# Patient Record
Sex: Female | Born: 1975 | Race: White | Hispanic: No | Smoking: Never smoker
Health system: Southern US, Community
[De-identification: ages and names within clinical notes are randomized; demographics above are authoritative.]

## PROBLEM LIST (undated history)

## (undated) DIAGNOSIS — C50411 Malignant neoplasm of upper-outer quadrant of right female breast: Secondary | ICD-10-CM

## (undated) DIAGNOSIS — J45909 Unspecified asthma, uncomplicated: Secondary | ICD-10-CM

## (undated) DIAGNOSIS — K219 Gastro-esophageal reflux disease without esophagitis: Secondary | ICD-10-CM

## (undated) DIAGNOSIS — M1990 Unspecified osteoarthritis, unspecified site: Secondary | ICD-10-CM

## (undated) DIAGNOSIS — C539 Malignant neoplasm of cervix uteri, unspecified: Secondary | ICD-10-CM

## (undated) DIAGNOSIS — A419 Sepsis, unspecified organism: Secondary | ICD-10-CM

## (undated) DIAGNOSIS — J189 Pneumonia, unspecified organism: Secondary | ICD-10-CM

## (undated) DIAGNOSIS — C50911 Malignant neoplasm of unspecified site of right female breast: Secondary | ICD-10-CM

## (undated) DIAGNOSIS — G43909 Migraine, unspecified, not intractable, without status migrainosus: Secondary | ICD-10-CM

## (undated) DIAGNOSIS — Z9289 Personal history of other medical treatment: Secondary | ICD-10-CM

## (undated) HISTORY — DX: Malignant neoplasm of upper-outer quadrant of right female breast: C50.411

## (undated) HISTORY — PX: FRACTURE SURGERY: SHX138

## (undated) NOTE — *Deleted (*Deleted)
[facility]  [address] [place],  [place]  [address] [phone]  Clinic Day:  09/22/2020  Referring physician: Galvin Proffer, MD   This document serves as a record of services personally performed by Gery Pray, MD. It was created on their behalf by Curry,Lauren E, a trained medical scribe. The creation of this record is based on the scribe's personal observations and the provider's statements to them.   CHIEF COMPLAINT:  CC: Metastatic breast cancer  Current Treatment:  denosumab   HISTORY OF PRESENT ILLNESS:  Monica Poole is a 55 y.o. female with a history of stage IIB right breast cancer diagnosed in June 2018.  Estrogen and progesterone receptors were positive at 95% and HER2 negative.  Ki67 was 70%.  She was seen at [facility] in July 2018 for consultation, but did not pursue treatment with [place] at that time.  Testing for hereditary breast and ovarian cancer with the [facility] Hereditary Cancer panel test in July 2018 did not reveal any clinically significant mutation or variants of uncertain significance.  She was going to go to [facility] for a second opinion, but did not pursue this, and we did not hear back from her regarding her cancer.  Her port was removed in 2018 due to septic infection.  She went to [facility] in [place] and was placed on neoadjuvant chemotherapy for 8 months, but with limited response.  In March 2019, she underwent bilateral mastectomies, but had complications with her reconstruction.  She had expanders placed, but never had the permanent implants inserted.  She received adjuvant radiation.  She was then placed on letrozole, but only completed 6 months.  She started having pain in her right hip in February 2020.  Hip x-rays in April revealed multifocal sclerotic bony metastases.  She then underwent a biopsy of the left anterior iliac bone in May, which confirmed metastatic  carcinoma favoring breast primary.  She was placed on palbociclib/fulvestrant, but only completed 4 months.  These were stopped due to problems with insurance and transportation to [place].    We began seeing her again in November 2020, at which time she was on fentanyl 25 mcg/hour with oxycodone/APAP 10/325 every 4 hours around the clock.  She is also on prednisone 20 mg daily for asthma, which she has been on chronically.  CT chest, abdomen, and pelvis in November revealed innumerable small sclerotic metastases throughout the axial and proximal appendicular skeleton, increased in size, number and degree of sclerosis, favoring progression of osseous metastatic disease.  No lymphadenopathy or other findings of extraosseous metastatic disease in the chest, abdomen, or pelvis.  She also underwent a nuclear medicine whole body bone scan, which revealed osseous metastases at left iliac bone, thoracic spine, and bilateral proximal femora.  We resumed palbociclib/fulvestrant in November.  She has had continued severe pain, especially of the hips, and we have had to titrate her narcotics steadily.  She is currently on fentanyl 100 mcg per hour with oxycodone 20 mg every 4 hours as needed for pain.  She received palliative radiation to the bilateral hips completed in December.  She was referred to plastic surgeon regarding her expanders.  She was added to the schedule on December 31st due to severe arthralgias, which she rated 10/10.  Her pain was significantly worse than usual and was in multiple areas.  Her current pain regimen was not effective.  She was on prednisone 20 mg once  daily, so we increased this to twice daily for pain.  She was instructed to take prednisone 20 mg three times daily temporarily.  Pantoprazole 40 mg twice daily was added due to the high-dose prednisone.  She had palmar erythrodysesthesia and was given information on that diagnosis and how to manage it.  She missed some of her injections due to  complications of her reconstructive surgery and we had her palbociclib on hold so that she could heal properly.    CT chest, abdomen and pelvis from May 3rd revealed development of a segment 3 hypoattenuating liver lesion, measuring 1.8 cm, concerning for new metastasis.  No other evidence of soft tissue metastasis within the chest, abdomen or pelvis.  Osseous metastasis are primarily similar.  New and progressive areas of sclerosis involving the T6 and S1 vertebral bodies were seen.  Labs from the same day were unremarkable except for a borderline low calcium of 8.6.  We had her increase her oral calcium 600 mg to three times daily.   Her pain was doing better this summer.    She is here for follow up prior to fulvestrant and denosumab.  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  She was so weak that she had to come in in a wheelchair.  She also had severe swelling of legs and face, and so I instructed her to taper the prednisone gradually.  She is down to prednisone 10 mg daily with improvement in her edema and bilateral leg pain.  Her calcium was slightly high, but she continued the same dose at my instructions due to Xgeva injections.  Left diagnostic mammogram and left breast ultrasound from October 7th revealed an indeterminate 1.2 cm mass involving the lower outer quadrant of the reconstructed left breast at the inframammary fold.  This may represent focal post surgical dense fibrosis or fat necrosis, though an oil cyst could not be confirmed on the spot tangential mammogram.  Biopsy was recommended and scheduled, but she missed this appointment.  CT chest, abdomen and pelvis from October 7th revealed interval increase in the size of a hypodense metastatic lesion of the left lobe of the liver measuring 3.2 x 2.4 cm, previously 2.1 x 2.0 cm.  Suspect subtle, new hypodense lesions of the superior left lobe of the liver and adjacent liver dome,  and interval increase in multiple sclerotic osseous lesions throughout the vertebral bodies, sternum and pelvis.  Overall constellation of findings consistent with worsened metastatic disease.  She reports pain of her upper back/shoulders and neck.  I advised that she finish her current cycle of palbociclib as she has 4 days left.  She has a scratch of her left hand from her cat that is inflamed and warm, so I will place her on Keflex 500 mg for 7 days.  She can continue her diuretic as needed, but I have advised her to use as little as possible.  She is scheduled with her dentist on October 28th for a broken tooth, so we will hold denosumab.  Her white count has decreased from 16.5 to 1.8 with an ANC of 850, her hemoglobin and platelets are normal.  Chemistries are unremarkable except for a potassium of 2.9, previously 4.0, and a calcium of 8.3.  Her appetite is good, and her weight is stable since her last visit.  She denies fever or chills.  She denies nausea, vomiting, bowel issues, or abdominal pain.  She  denies sore throat, cough, dyspnea, or chest pain.   INTERVAL HISTORY:  Monica Poole is here for follow up ***.   Her  appetite is good, and she has gained/lost _ pounds since her last visit.  She denies fever, chills or other signs of infection.  She denies nausea, vomiting, bowel issues, or abdominal pain.  She denies sore throat, cough, dyspnea, or chest pain.   REVIEW OF SYSTEMS:  Review of Systems - Oncology   VITALS:  There were no vitals taken for this visit.  Wt Readings from Last 3 Encounters:  09/17/20 151 lb 4 oz (68.6 kg)  08/27/20 151 lb 4 oz (68.6 kg)  03/02/20 139 lb 6.4 oz (63.2 kg)    There is no height or weight on file to calculate BMI.  Performance status (ECOG): {CHL ONC [id number]  PHYSICAL EXAM:  Physical Exam  LABS:   CBC Latest Ref Rng & Units 08/25/2020 12/15/2019 07/29/2018  WBC - 1.8 3.5 8.6  Hemoglobin 12.0 - 16.0 13.1 13.8 11.8(L)  Hematocrit 36 - 46 39  41.1 37.5  Platelets 150 - 399 262 297 207   CMP Latest Ref Rng & Units 08/25/2020 07/29/2018 12/04/2017  Glucose 70 - 99 mg/dL - 161(W) 960(A)  BUN 4 - 21 7 11 8   Creatinine 0.5 - 1.1 0.8 0.84 0.79  Sodium 137 - 147 138 137 139  Potassium 3.4 - 5.3 2.9(A) 3.4(L) 4.0  Chloride 99 - 108 101 103 111  CO2 13 - 22 32(A) 23 17(L)  Calcium 8.7 - 10.7 8.3(A) 8.8(L) 8.8(L)  Total Protein 6.5 - 8.1 g/dL - 6.5 -  Total Bilirubin 0.3 - 1.2 mg/dL - 1.0 -  Alkaline Phos 25 - 125 70 119 -  AST 13 - 35 37(A) 28 -  ALT 7 - 35 24 22 -     No results found for: CEA1 / No results found for: CEA1 No results found for: PSA1 No results found for: VWU981 No results found for: XBJ478  No results found for: TOTALPROTELP, ALBUMINELP, A1GS, A2GS, BETS, BETA2SER, GAMS, MSPIKE, SPEI No results found for: TIBC, FERRITIN, IRONPCTSAT No results found for: LDH   STUDIES:  No results found.   Allergies:  Allergies  Allergen Reactions  . Bee Venom Anaphylaxis  . Dicyclomine Other (See Comments)    CARDIAC ARREST   . Latex Rash and Other (See Comments)    BLISTERS   . Wasp Venom Protein Anaphylaxis  . Bentyl [Dicyclomine Hcl] Other (See Comments)    CARDIAC ARREST  . Tape Rash and Other (See Comments)    Blisters, also- ONLY PAPER TAPE!!    Current Medications: Current Outpatient Medications  Medication Sig Dispense Refill  . albuterol (ACCUNEB) 1.25 MG/3ML nebulizer solution Take 1 ampule by nebulization every 4 (four) hours as needed for wheezing.     Marland Kitchen albuterol (VENTOLIN HFA) 108 (90 Base) MCG/ACT inhaler Can inhale two puffs every four to six hours as needed for cough or wheeze. 8 g 1  . budesonide (PULMICORT) 0.5 MG/2ML nebulizer solution Take 0.5 mg by nebulization 2 (two) times daily as needed (wheezing).    . calcium-vitamin D (OSCAL WITH D) 500-200 MG-UNIT tablet Take 1 tablet by mouth.    . denosumab (XGEVA) 120 MG/1.7ML SOLN injection Inject 120 mg into the skin once.    . fentaNYL  (DURAGESIC) 100 MCG/HR 1 patch every 3 (three) days.    . fentaNYL (DURAGESIC) 50 MCG/HR SMARTSIG:50 MCG Topical Every 3 Days    .  Fluticasone-Umeclidin-Vilant (TRELEGY ELLIPTA) 200-62.5-25 MCG/INH AEPB Inhale 1 Dose into the lungs daily. Rinse, gargle, and spit after use. 60 each 5  . fulvestrant (FASLODEX) 250 MG/5ML injection Inject 500 mg into the muscle once. One injection each buttock over 1-2 minutes. Warm prior to use.    . furosemide (LASIX) 20 MG tablet Take 20 mg by mouth.    Marland Kitchen ipratropium (ATROVENT) 0.06 % nasal spray Can use one to two sprays in each nostril every six hours as needed to dry up nose. 15 mL 5  . Ipratropium-Albuterol (COMBIVENT RESPIMAT) 20-100 MCG/ACT AERS respimat Inhale 1 puff into the lungs every 6 (six) hours.    Marland Kitchen ipratropium-albuterol (DUONEB) 0.5-2.5 (3) MG/3ML SOLN Take 3 mLs by nebulization every 6 (six) hours as needed. (Patient taking differently: Take 3 mLs by nebulization every 6 (six) hours as needed (for shortness of breath). ) 3 mL 5  . magic mouthwash SOLN Take by mouth.    . meclizine (ANTIVERT) 25 MG tablet Take 25 mg by mouth 3 (three) times daily as needed.    . mometasone-formoterol (DULERA) 200-5 MCG/ACT AERO Inhale two puffs twice daily to prevent cough or wheeze.  Rinse, gargle, and spit after use. (Patient taking differently: Inhale 2 puffs into the lungs 2 (two) times daily as needed (for "flares" and rinse mouth afterwards). ) 1 Inhaler 5  . ondansetron (ZOFRAN) 4 MG tablet Take 1 tablet (4 mg total) by mouth every 8 (eight) hours as needed for nausea or vomiting. 20 tablet 0  . ondansetron (ZOFRAN) 8 MG tablet Take 8 mg by mouth every 8 (eight) hours as needed for nausea or vomiting.     . Oxycodone HCl 20 MG TABS Take 1 tablet by mouth every 4 (four) hours as needed.    . palbociclib (IBRANCE) 125 MG tablet Take 125 mg by mouth daily. Take for 21 days on, 7 days off, repeat every 28 days.    . pantoprazole (PROTONIX) 40 MG tablet Take 40 mg by  mouth 2 (two) times daily.    . predniSONE (DELTASONE) 20 MG tablet Take 20 mg by mouth daily with breakfast.    . prochlorperazine (COMPAZINE) 10 MG tablet Take 10 mg by mouth every 8 (eight) hours as needed for nausea or vomiting.     . triamcinolone cream (KENALOG) 0.1 % Apply 1 application topically as needed (to affected, irritated sites). Prn     . triamcinolone cream (KENALOG) 0.1 % Apply 1 application topically 2 (two) times daily. 30 g 0  . venlafaxine XR (EFFEXOR-XR) 150 MG 24 hr capsule Take 150 mg by mouth daily.    Marland Kitchen venlafaxine XR (EFFEXOR-XR) 75 MG 24 hr capsule Take 75 mg by mouth daily after breakfast.    . XANAX 0.5 MG tablet Take 0.5 mg by mouth 3 (three) times daily as needed.     . zolpidem (AMBIEN) 10 MG tablet Take 10 mg by mouth at bedtime as needed for sleep.     No current facility-administered medications for this visit.     ASSESSMENT & PLAN:   Assessment:   1. Stage IIB right breast cancer diagnosed in June 2018.  This was treated at the [facility] in [place], [place] with neoadjuvant chemotherapy, and adjuvant radiation.  She underwent bilateral mastectomies in March 2019.   2. History of cervical cancer at a young age, treated with surgery and radiation.  3. 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  She was placed on palbociclib/fulvestrant, but only completed 4 months of therapy, then discontinued that on her own.  We assumed follow-up and restarted palbociclib/fulvestrant in November 2020, and started her on monthly denosumab, but she has missed some doses.  She received palliative radiation to the bilateral hips completed in December 2020.  Palbociclib was resumed in May 2021, and then placed on hold this summer due to poor healing of her reconstruction.  We resumed this at the end of June.  4. Depression, now on Effexor dose at 225 mg daily with fairly good  control of her symptoms.  She still lacks motivation at times.  5. Bilateral breast reconstruction, with implants placed on February 17th.  This was healing well, but had to be removed due to the skin splitting open.  I do feel a nodule in the lateral left reconstruction and a biopsy was scheduled.  In view of these findings, we will hold off for now, and monitor this area.  6. Diagnosis of breast cancer at a very young age.  She did not have clinically significant mutation or variants of uncertain significance on [facility]Risk done in July 2018.  Testing for PIK3CA was negative.  7. Lymphedema of the right upper extremity.    8. 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   9. Palmar erythrodysesthesia, secondary to palbociclib, which is stable.  10. Hypodense metastatic liver lesion of the left lobe now measuring 3.2 x 2.4 cm.  Suspect subtle, new hypodense lesions were also observed on most recent CT imaging in October.  11. Mild hypocalcemia.  We will have her continue oral calcium 600 mg three times daily.  12. Severe bilateral leg pain and proximal muscle weakness, improved with prednisone taper.  She is now down to 10 mg daily.  Her edema has improved as well.  13. Dyspepsia and acid reflux, also related to her corticosteroids.  She will continue the PPI twice daily.  14. Severe hypokalemia due to diuretic.  Her edema has improved and so she will decrease this.  We will administer 20 meq IV and place her on oral supplement 20 meq BID.  Plan: CT chest, abdomen and pelvis revealed interval increase in the size of a hypodense metastatic lesion of the left lobe of the liver measuring 3.2 x 2.4 cm, previously 2.1 x 2.0 cm.  Suspect subtle, new hypodense lesions of the superior left lobe of the liver and adjacent liver dome, and interval increase in multiple sclerotic osseous lesions throughout the vertebral bodies, sternum and pelvis were also seen.  We discussed the imaging findings  with her and I now recommend that we switch treatments.  Options for oral therapy would include hormonal therapy with Aromasin as well as oral chemotherapy with Afinitor 10 mg daily.  Systemic options would include Taxotere, eribulin, Navelbine, gemcitabine or a combination of gemcitabine/Navelbine.  I reviewed the schedule and possible toxicities associated with these therapies.  The patient declines port placement due to previous complications, but is agreeable to a PICC line for systemic treatment.  We will give her some time to make a decision.    She knows to finish her current cycle of palbociclib and we will administer one final fulvestrant injection tomorrow.  We will hold off on denosumab due to her upcoming dental work for broken tooth.  As her potassium is low we will also administer 20 meq IV and place her on oral supplement 20 meq BID as well.  I will  send in a prescription for Keflex 500 mg TID for 7 days for her probable skin injection.  For now, we will hold off on breast biopsy since she will require a change in therapy anyway.  I will send in refills for her pain medications, Ambien and Xanax today.  The patient verbalizes understanding of an agreement to the plan today.  She knows to call the office should any new questions or concerns arise.   I provided *** minutes (1:53 PM - 1:53 PM) of face-to-face time during this this encounter and > 50% was spent counseling as documented under my assessment and plan.    Dellia Beckwith, MD [facility] [address] [place] [address] Dept: [phone] Dept Fax: [phone]   I, Foye Deer, am acting as scribe for Dellia Beckwith, MD  I have reviewed this report as typed by the medical scribe, and it is complete and accurate.

---
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
## 1998-03-11 ENCOUNTER — Emergency Department (HOSPITAL_COMMUNITY): Admission: EM | Admit: 1998-03-11 | Discharge: 1998-03-11 | Payer: Self-pay | Admitting: Emergency Medicine

## 1998-03-15 ENCOUNTER — Other Ambulatory Visit: Admission: RE | Admit: 1998-03-15 | Discharge: 1998-03-15 | Payer: Self-pay | Admitting: Obstetrics & Gynecology

## 1998-03-30 ENCOUNTER — Inpatient Hospital Stay (HOSPITAL_COMMUNITY): Admission: AD | Admit: 1998-03-30 | Discharge: 1998-03-30 | Payer: Self-pay | Admitting: Family Medicine

## 1998-04-24 ENCOUNTER — Inpatient Hospital Stay (HOSPITAL_COMMUNITY): Admission: AD | Admit: 1998-04-24 | Discharge: 1998-04-24 | Payer: Self-pay | Admitting: Obstetrics & Gynecology

## 1998-05-10 ENCOUNTER — Ambulatory Visit (HOSPITAL_COMMUNITY): Admission: RE | Admit: 1998-05-10 | Discharge: 1998-05-10 | Payer: Self-pay | Admitting: Obstetrics and Gynecology

## 1998-10-12 ENCOUNTER — Inpatient Hospital Stay (HOSPITAL_COMMUNITY): Admission: AD | Admit: 1998-10-12 | Discharge: 1998-10-15 | Payer: Self-pay | Admitting: *Deleted

## 1999-06-15 ENCOUNTER — Other Ambulatory Visit: Admission: RE | Admit: 1999-06-15 | Discharge: 1999-06-15 | Payer: Self-pay | Admitting: Obstetrics and Gynecology

## 2000-01-03 ENCOUNTER — Inpatient Hospital Stay (HOSPITAL_COMMUNITY): Admission: AD | Admit: 2000-01-03 | Discharge: 2000-01-03 | Payer: Self-pay | Admitting: Obstetrics and Gynecology

## 2000-01-16 ENCOUNTER — Inpatient Hospital Stay (HOSPITAL_COMMUNITY): Admission: AD | Admit: 2000-01-16 | Discharge: 2000-01-18 | Payer: Self-pay | Admitting: Obstetrics and Gynecology

## 2014-06-22 DIAGNOSIS — R319 Hematuria, unspecified: Secondary | ICD-10-CM | POA: Insufficient documentation

## 2015-01-29 HISTORY — PX: WRIST FRACTURE SURGERY: SHX121

## 2017-04-26 HISTORY — PX: BREAST BIOPSY: SHX20

## 2017-04-29 HISTORY — PX: PORTA CATH INSERTION: CATH118285

## 2017-05-21 DIAGNOSIS — R590 Localized enlarged lymph nodes: Secondary | ICD-10-CM | POA: Diagnosis not present

## 2017-05-21 DIAGNOSIS — C50411 Malignant neoplasm of upper-outer quadrant of right female breast: Secondary | ICD-10-CM | POA: Diagnosis not present

## 2017-05-21 DIAGNOSIS — Z170 Estrogen receptor positive status [ER+]: Secondary | ICD-10-CM | POA: Diagnosis not present

## 2017-05-24 DIAGNOSIS — C50911 Malignant neoplasm of unspecified site of right female breast: Secondary | ICD-10-CM | POA: Insufficient documentation

## 2017-05-25 DIAGNOSIS — C50411 Malignant neoplasm of upper-outer quadrant of right female breast: Secondary | ICD-10-CM | POA: Diagnosis not present

## 2017-05-28 DIAGNOSIS — C50511 Malignant neoplasm of lower-outer quadrant of right female breast: Secondary | ICD-10-CM | POA: Diagnosis present

## 2017-05-30 DIAGNOSIS — F419 Anxiety disorder, unspecified: Secondary | ICD-10-CM | POA: Diagnosis not present

## 2017-05-30 DIAGNOSIS — R945 Abnormal results of liver function studies: Secondary | ICD-10-CM | POA: Diagnosis not present

## 2017-05-30 DIAGNOSIS — N649 Disorder of breast, unspecified: Secondary | ICD-10-CM | POA: Diagnosis not present

## 2017-05-30 DIAGNOSIS — C50911 Malignant neoplasm of unspecified site of right female breast: Secondary | ICD-10-CM | POA: Diagnosis not present

## 2017-05-30 DIAGNOSIS — K769 Liver disease, unspecified: Secondary | ICD-10-CM | POA: Diagnosis not present

## 2017-05-30 HISTORY — PX: PORTA CATH REMOVAL: CATH118286

## 2017-06-08 DIAGNOSIS — C773 Secondary and unspecified malignant neoplasm of axilla and upper limb lymph nodes: Secondary | ICD-10-CM | POA: Diagnosis not present

## 2017-06-08 DIAGNOSIS — Z170 Estrogen receptor positive status [ER+]: Secondary | ICD-10-CM | POA: Diagnosis not present

## 2017-06-08 DIAGNOSIS — C50911 Malignant neoplasm of unspecified site of right female breast: Secondary | ICD-10-CM | POA: Diagnosis not present

## 2017-06-08 DIAGNOSIS — F419 Anxiety disorder, unspecified: Secondary | ICD-10-CM | POA: Diagnosis not present

## 2017-10-16 ENCOUNTER — Encounter (HOSPITAL_COMMUNITY): Payer: Self-pay

## 2017-10-16 ENCOUNTER — Inpatient Hospital Stay (HOSPITAL_COMMUNITY): Payer: BLUE CROSS/BLUE SHIELD

## 2017-10-16 ENCOUNTER — Inpatient Hospital Stay (HOSPITAL_COMMUNITY)
Admission: EM | Admit: 2017-10-16 | Discharge: 2017-10-18 | DRG: 202 | Disposition: A | Discharge status: Home / Self Care | Payer: BLUE CROSS/BLUE SHIELD | Attending: Family Medicine | Admitting: Family Medicine

## 2017-10-16 ENCOUNTER — Other Ambulatory Visit: Payer: Self-pay

## 2017-10-16 DIAGNOSIS — Z170 Estrogen receptor positive status [ER+]: Secondary | ICD-10-CM | POA: Diagnosis not present

## 2017-10-16 DIAGNOSIS — C50911 Malignant neoplasm of unspecified site of right female breast: Secondary | ICD-10-CM | POA: Diagnosis present

## 2017-10-16 DIAGNOSIS — E876 Hypokalemia: Secondary | ICD-10-CM | POA: Diagnosis present

## 2017-10-16 DIAGNOSIS — E871 Hypo-osmolality and hyponatremia: Secondary | ICD-10-CM | POA: Diagnosis present

## 2017-10-16 DIAGNOSIS — J189 Pneumonia, unspecified organism: Secondary | ICD-10-CM | POA: Diagnosis not present

## 2017-10-16 DIAGNOSIS — D638 Anemia in other chronic diseases classified elsewhere: Secondary | ICD-10-CM | POA: Diagnosis present

## 2017-10-16 DIAGNOSIS — D899 Disorder involving the immune mechanism, unspecified: Secondary | ICD-10-CM | POA: Diagnosis present

## 2017-10-16 DIAGNOSIS — J440 Chronic obstructive pulmonary disease with acute lower respiratory infection: Secondary | ICD-10-CM | POA: Diagnosis present

## 2017-10-16 DIAGNOSIS — J209 Acute bronchitis, unspecified: Secondary | ICD-10-CM | POA: Diagnosis present

## 2017-10-16 DIAGNOSIS — J9811 Atelectasis: Secondary | ICD-10-CM | POA: Diagnosis present

## 2017-10-16 DIAGNOSIS — C799 Secondary malignant neoplasm of unspecified site: Secondary | ICD-10-CM | POA: Diagnosis present

## 2017-10-16 DIAGNOSIS — J45909 Unspecified asthma, uncomplicated: Secondary | ICD-10-CM | POA: Diagnosis not present

## 2017-10-16 DIAGNOSIS — R0602 Shortness of breath: Secondary | ICD-10-CM

## 2017-10-16 DIAGNOSIS — Z66 Do not resuscitate: Secondary | ICD-10-CM | POA: Diagnosis present

## 2017-10-16 HISTORY — DX: Personal history of other medical treatment: Z92.89

## 2017-10-16 HISTORY — DX: Migraine, unspecified, not intractable, without status migrainosus: G43.909

## 2017-10-16 HISTORY — DX: Malignant neoplasm of cervix uteri, unspecified: C53.9

## 2017-10-16 HISTORY — DX: Unspecified asthma, uncomplicated: J45.909

## 2017-10-16 HISTORY — DX: Malignant neoplasm of unspecified site of right female breast: C50.911

## 2017-10-16 HISTORY — DX: Pneumonia, unspecified organism: J18.9

## 2017-10-16 HISTORY — DX: Unspecified osteoarthritis, unspecified site: M19.90

## 2017-10-16 LAB — COMPREHENSIVE METABOLIC PANEL
ALT: 40 U/L (ref 14–54)
AST: 33 U/L (ref 15–41)
Albumin: 3.8 g/dL (ref 3.5–5.0)
Alkaline Phosphatase: 47 U/L (ref 38–126)
Anion gap: 9 (ref 5–15)
BUN: 11 mg/dL (ref 6–20)
CO2: 21 mmol/L — ABNORMAL LOW (ref 22–32)
Calcium: 9 mg/dL (ref 8.9–10.3)
Chloride: 101 mmol/L (ref 101–111)
Creatinine, Ser: 0.76 mg/dL (ref 0.44–1.00)
GFR calc Af Amer: >60 mL/min (ref >60)
GFR calc non Af Amer: >60 mL/min (ref >60)
Glucose, Bld: 114 mg/dL — ABNORMAL HIGH (ref 65–99)
Potassium: 4 mmol/L (ref 3.5–5.1)
Sodium: 131 mmol/L — ABNORMAL LOW (ref 135–145)
Total Bilirubin: 0.7 mg/dL (ref 0.3–1.2)
Total Protein: 6.6 g/dL (ref 6.5–8.1)

## 2017-10-16 LAB — URINALYSIS, ROUTINE W REFLEX MICROSCOPIC
Bilirubin Urine: NEGATIVE
Glucose, UA: NEGATIVE mg/dL
Hgb urine dipstick: NEGATIVE
Ketones, ur: NEGATIVE mg/dL
Leukocytes, UA: NEGATIVE
Nitrite: NEGATIVE
Protein, ur: NEGATIVE mg/dL
Specific Gravity, Urine: 1.008 (ref 1.005–1.030)
pH: 6 (ref 5.0–8.0)

## 2017-10-16 LAB — CBC WITH DIFFERENTIAL/PLATELET
Basophils Absolute: 0 10*3/uL (ref 0.0–0.1)
Basophils Relative: 0 %
Eosinophils Absolute: 0 10*3/uL (ref 0.0–0.7)
Eosinophils Relative: 0 %
HCT: 30.8 % — ABNORMAL LOW (ref 36.0–46.0)
Hemoglobin: 10.2 g/dL — ABNORMAL LOW (ref 12.0–15.0)
Lymphocytes Relative: 3 %
Lymphs Abs: 0.2 10*3/uL — ABNORMAL LOW (ref 0.7–4.0)
MCH: 32.3 pg (ref 26.0–34.0)
MCHC: 33.1 g/dL (ref 30.0–36.0)
MCV: 97.5 fL (ref 78.0–100.0)
Monocytes Absolute: 0.1 10*3/uL (ref 0.1–1.0)
Monocytes Relative: 1 %
Neutro Abs: 5.3 10*3/uL (ref 1.7–7.7)
Neutrophils Relative %: 96 %
Platelets: 254 10*3/uL (ref 150–400)
RBC: 3.16 MIL/uL — ABNORMAL LOW (ref 3.87–5.11)
RDW: 17 % — ABNORMAL HIGH (ref 11.5–15.5)
WBC: 5.5 10*3/uL (ref 4.0–10.5)

## 2017-10-16 LAB — I-STAT BETA HCG BLOOD, ED (MC, WL, AP ONLY): I-stat hCG, quantitative: <5 m[IU]/mL (ref <5)

## 2017-10-16 LAB — LACTIC ACID, PLASMA
Lactic Acid, Venous: 2 mmol/L (ref 0.5–1.9)
Lactic Acid, Venous: 2.1 mmol/L (ref 0.5–1.9)

## 2017-10-16 LAB — PROTIME-INR
INR: 1.03
Prothrombin Time: 13.4 seconds (ref 11.4–15.2)

## 2017-10-16 LAB — I-STAT CG4 LACTIC ACID, ED
Lactic Acid, Venous: 2.52 mmol/L (ref 0.5–1.9)
Lactic Acid, Venous: 2.1 mmol/L (ref 0.5–1.9)

## 2017-10-16 LAB — INFLUENZA PANEL BY PCR (TYPE A & B)
Influenza A By PCR: NEGATIVE
Influenza B By PCR: NEGATIVE

## 2017-10-16 MED ORDER — SODIUM CHLORIDE 0.9 % IV BOLUS (SEPSIS)
1000.0000 mL | Freq: Once | INTRAVENOUS | Status: AC
  Administered 2017-10-16: 1000 mL via INTRAVENOUS

## 2017-10-16 MED ORDER — OXYCODONE HCL 5 MG PO TABS: 30.0000 mg | ORAL_TABLET | Freq: Two times a day (BID) | ORAL | Status: DC | PRN

## 2017-10-16 MED ORDER — ALBUTEROL SULFATE (2.5 MG/3ML) 0.083% IN NEBU: 2.5000 mg | INHALATION_SOLUTION | RESPIRATORY_TRACT | Status: DC | PRN

## 2017-10-16 MED ORDER — VANCOMYCIN HCL 10 G IV SOLR
1500.0000 mg | Freq: Once | INTRAVENOUS | Status: AC
  Administered 2017-10-16: 1500 mg via INTRAVENOUS
  Filled 2017-10-16: qty 1500

## 2017-10-16 MED ORDER — VANCOMYCIN HCL IN DEXTROSE 750-5 MG/150ML-% IV SOLN
750.0000 mg | Freq: Three times a day (TID) | INTRAVENOUS | Status: DC
  Filled 2017-10-16: qty 150

## 2017-10-16 MED ORDER — VANCOMYCIN HCL IN DEXTROSE 750-5 MG/150ML-% IV SOLN
750.0000 mg | Freq: Three times a day (TID) | INTRAVENOUS | Status: DC
  Administered 2017-10-16 – 2017-10-18 (×5): 750 mg via INTRAVENOUS
  Filled 2017-10-16 (×6): qty 150

## 2017-10-16 MED ORDER — ENOXAPARIN SODIUM 40 MG/0.4ML ~~LOC~~ SOLN
40.0000 mg | SUBCUTANEOUS | Status: DC
  Administered 2017-10-16 – 2017-10-17 (×3): 40 mg via SUBCUTANEOUS
  Filled 2017-10-16 (×3): qty 0.4

## 2017-10-16 MED ORDER — BUDESONIDE 0.5 MG/2ML IN SUSP: 0.5000 mg | Freq: Two times a day (BID) | RESPIRATORY_TRACT | Status: DC | PRN

## 2017-10-16 MED ORDER — OXYCODONE HCL 5 MG PO TABS
30.0000 mg | ORAL_TABLET | Freq: Two times a day (BID) | ORAL | Status: DC | PRN
  Administered 2017-10-16 – 2017-10-17 (×3): 30 mg via ORAL
  Filled 2017-10-16 (×4): qty 6

## 2017-10-16 MED ORDER — IPRATROPIUM-ALBUTEROL 0.5-2.5 (3) MG/3ML IN SOLN
3.0000 mL | RESPIRATORY_TRACT | Status: DC
  Administered 2017-10-16 – 2017-10-17 (×4): 3 mL via RESPIRATORY_TRACT
  Filled 2017-10-16 (×4): qty 3

## 2017-10-16 MED ORDER — LEVALBUTEROL HCL 0.63 MG/3ML IN NEBU
0.6300 mg | INHALATION_SOLUTION | Freq: Once | RESPIRATORY_TRACT | Status: AC
  Administered 2017-10-16: 0.63 mg via RESPIRATORY_TRACT
  Filled 2017-10-16: qty 3

## 2017-10-16 MED ORDER — DEXTROSE 5 % IV SOLN
1.0000 g | Freq: Three times a day (TID) | INTRAVENOUS | Status: DC
  Administered 2017-10-17 – 2017-10-18 (×5): 1 g via INTRAVENOUS
  Filled 2017-10-16 (×7): qty 1

## 2017-10-16 MED ORDER — METHYLPREDNISOLONE SODIUM SUCC 125 MG IJ SOLR
125.0000 mg | Freq: Once | INTRAMUSCULAR | Status: AC
  Administered 2017-10-16: 125 mg via INTRAVENOUS
  Filled 2017-10-16: qty 2

## 2017-10-16 MED ORDER — SODIUM CHLORIDE 0.9 % IV SOLN
INTRAVENOUS | Status: DC
  Administered 2017-10-16: 17:00:00 via INTRAVENOUS

## 2017-10-16 MED ORDER — IPRATROPIUM-ALBUTEROL 0.5-2.5 (3) MG/3ML IN SOLN
3.0000 mL | RESPIRATORY_TRACT | Status: DC
  Filled 2017-10-16: qty 3

## 2017-10-16 MED ORDER — DEXTROSE 5 % IV SOLN
1.0000 g | Freq: Three times a day (TID) | INTRAVENOUS | Status: DC
  Administered 2017-10-16: 1 g via INTRAVENOUS
  Filled 2017-10-16 (×2): qty 1

## 2017-10-16 NOTE — ED Provider Notes (Signed)
[facility] EMERGENCY DEPARTMENT Provider Note   CSN: [id number] Arrival date & time: 10/16/17  1237  History   Chief Complaint No chief complaint on file.  HPI Monica Poole is a 41 y.o. female with Metastatic adenocarcinoma of the right breast (ER+, HER2 -) currently receiving Taxol who presented to the ED from urgent care for a pneumonia. She states that after her first chemotherapy treatment with Taxol (this is her 5th total session, she was previously on another medication, she does not remember the name) she began to experience rhinorrhea this progressed to a cough and SOB. She attempted her home inhalers without relief and took some old prednisone. On 12/17 she continued to have SOB, cough, and high fevers (102F). She called her PCP who prescribed Levaquin. She took 1 day of the Levaquin. This AM she woke up with extreme fatigue and continued high fevers. She felt like this was serious so she sought further medical evaluation at an urgent care. There a chest x-ray illustrated a left basilar infiltrate. She does have a history of asthma with several hospitalizations for PNA and pneumothorax. She does not have a port she gets a PICC line with each chemo session as she previously had an infected port.   She endorses headaches, myalgias, arthralgias, and darker urine. She denies N/V, abdominal pain, CP, diarrhea, dysuria, increased urinary frequency.   Past Medical History:  Diagnosis Date  . Arthritis   . Cancer [facility])    breast   Patient Active Problem List   Diagnosis Date Noted  . Pneumonia 10/16/2017   Past Surgical History:  Procedure Laterality Date  . CERVIX SURGERY     OB History    No data available     Home Medications    Prior to Admission medications   Medication Sig Start Date End Date Taking? Authorizing Provider  budesonide (PULMICORT) 0.5 MG/2ML nebulizer solution Take 0.5 mg by nebulization 2 (two) times daily as needed (wheezing).   Yes  [provider]  Ipratropium-Albuterol (COMBIVENT RESPIMAT) 20-100 MCG/ACT AERS respimat Inhale 2 puffs into the lungs every 6 (six) hours as needed for wheezing.   Yes [provider]  levofloxacin (LEVAQUIN) 750 MG tablet Take 750 mg by mouth daily. 08/12/17 10/25/17 Yes [provider]  oxycodone (ROXICODONE) 30 MG immediate release tablet Take 30 mg by mouth every 12 (twelve) hours.   Yes [provider]   Family History No family history on file.  Social History Social History   Tobacco Use  . Smoking status: Never Smoker  . Smokeless tobacco: Never Used  Substance Use Topics  . Alcohol use: No    Frequency: Never  . Drug use: No   Allergies   Bee venom; Dicyclomine; Latex; and Bentyl [dicyclomine hcl]  Review of Systems All systems reviewed and are negative for acute change except as noted in the HPI.  Physical Exam Updated Vital Signs BP (!) 90/50   Pulse 97   Temp 98.2 F (36.8 C) (Oral)   Resp 17   Ht '5\' 3"'  (1.6 m)   Wt 74.8 kg (165 lb)   SpO2 96%   BMI 29.23 kg/m   General: Well nourished female in no acute distress HENT: Normocephalic, atraumatic, moist mucus membranes, oropharynx is clear without exudates  Pulm: End expiratory wheezing presented, no crackles or rhonchi appreciated CV: RRR, no murmurs, no rubs  Abdomen: Active bowel sounds, soft, non-distended, no tenderness to palpation  Extremities: No LE edema, radial pulses  palpable bilaterally  Skin: Warm and dry with no new rashes noted Neuro: Alert and oriented x 3  ED Treatments / Results  Labs (all labs ordered are listed, but only abnormal results are displayed) Labs Reviewed  COMPREHENSIVE METABOLIC PANEL - Abnormal; Notable for the following components:      Result Value   Sodium 131 (*)    CO2 21 (*)    Glucose, Bld 114 (*)    All other components within normal limits  CBC WITH DIFFERENTIAL/PLATELET - Abnormal; Notable for the following components:    RBC 3.16 (*)    Hemoglobin 10.2 (*)    HCT 30.8 (*)    RDW 17.0 (*)    Lymphs Abs 0.2 (*)    All other components within normal limits  I-STAT CG4 LACTIC ACID, ED - Abnormal; Notable for the following components:   Lactic Acid, Venous 2.52 (*)    All other components within normal limits  CULTURE, BLOOD (ROUTINE X 2)  CULTURE, BLOOD (ROUTINE X 2)  CULTURE, EXPECTORATED SPUTUM-ASSESSMENT  GRAM STAIN  PROTIME-INR  URINALYSIS, ROUTINE W REFLEX MICROSCOPIC  HIV ANTIBODY (ROUTINE TESTING)  STREP PNEUMONIAE URINARY ANTIGEN  I-STAT BETA HCG BLOOD, ED ([facility], [facility], [facility] ONLY)  I-STAT CG4 LACTIC ACID, ED   EKG  EKG Interpretation None      Radiology No results found.  Procedures Procedures (including critical care time)  Medications Ordered in ED Medications  ceFEPIme (MAXIPIME) 1 g in dextrose 5 % 50 mL IVPB (not administered)  vancomycin (VANCOCIN) 1,500 mg in sodium chloride 0.9 % 500 mL IVPB (1,500 mg Intravenous New Bag/Given 10/16/17 1430)  vancomycin (VANCOCIN) IVPB 750 mg/150 ml premix (not administered)  sodium chloride 0.9 % bolus 1,000 mL (not administered)  sodium chloride 0.9 % bolus 1,000 mL (not administered)  sodium chloride 0.9 % bolus 1,000 mL (1,000 mLs Intravenous New Bag/Given 10/16/17 1402)  levalbuterol (XOPENEX) nebulizer solution 0.63 mg (0.63 mg Nebulization Given 10/16/17 1402)   Initial Impression / Assessment and Plan / ED Course  I have reviewed the triage vital signs and the nursing notes.  Pertinent labs & imaging results that were available during my care of the patient were reviewed by me and considered in my medical decision making (see chart for details).    41 y.o. Female with Metastatic adenocarcinoma of the right breast (ER+, HER2 -) currently receiving Taxol who presented to the ED from urgent care for a left basilar pneumonia. She has multiple risk factors for drug resistant organisms therefore we will start Cefepime and Vancomycin. Peripheral  IV started and IVF NS bolus will be given. She is currently hemodynamically stable and not in respiratory distress. She will need to be admitted for further evaluation and management.   She does have a history of asthma. She currently has some end-expiratory wheezing. We will treat her with Xopenex nebulizer.   Discussed with the patient and family at bedside that she will need to be admitted for further treatment. She expresses understanding. Spoke with Family medicine who will admit the patient for further evaluation and management.   She receives her chemotherapy and treatment from [facility] [name].   Final Clinical Impressions(s) / ED Diagnoses   Final diagnoses:  Healthcare-associated pneumonia   ED Discharge Orders    None       Ina Homes, MD 10/16/17 1449    Ina Homes, MD 10/16/17 1510    Macarthur Critchley, MD 10/18/17 1504

## 2017-10-16 NOTE — H&P (Signed)
[facility] Admission History and Physical Service Pager: [phone]  Patient name: Monica Poole Medical record number: [id number] Date of birth: [birth year] Age: 41 y.o. Gender: female  Primary Care Provider: [name], MD Consultants: none Code Status: DNR  Chief Complaint: SOB  Assessment and Plan: Monica Poole is a 41 y.o. female presenting with shortness of breath and pneumonia found at [facility]. PMH is significant for breast cancer  Shortness of breath- likely due to HCAP as seen on outpatient CXR. Patient with symptoms of fever, congestion, and SOB that has been progressively worsening since Friday. Failed outpatient levaquin (day #2). Patient is immunocompromised and has had recent hospital exposure for chemotherapy. Patient also has likely sick contact in airport and airplane. Afebrile in ED, but recent administration of motrin in urgent care prior to admission where temperature was 102. Lactic acid elevated, trended to 2.52>2.10 after 1L fluid bolus in ED. No leukocytosis, WBC of 5.5. Wells score of 2.5 so less likely PE, but cannot rule out given hypercoagulable state due to cancer and frequent travel via plane to [place] for chemotherapy. Unlikely cardiac given age and no cardiac history.  -admit to telemetry, attending Dr. Gwendlyn Deutscher  -strongly consider CTA if respiratory status declines, patient desaturates, or tachycardia persists despite continued abx treatment  -repeat 1L fluid bolus ordered then continue NS at 75cc/hr -trend fever curve -continuous cardiac monitoring -continuous pulse ox  -continue vancomycin (day #1) and cefepime (day #1) for HCAP -urine strep pneumonia antigen pending -treat asthma: duonebs q4hrs, albuterol and pulmicort prn  -steroid burst -am BMP, CBC  -Influenza swab pending, droplet precautions on -obtain 2-view CXR as we cannot see UC CXR -supplemental O2 prn  -incentive spirometry q2hrs while awake  Asthma Triggers are  seasonal changes, pollen, and viral infections. Home meds: pulmicort prn, combivent as needed. Patient with diffuse wheezing on exam. -albuterol prn -pulmicort -duonebs scheduled q4hrs  -will give 1 dose solu-medrol today, can begin oral prednisone on 12/19  Metastatic breast cancer Diagnosed in June 2018. Patient currently on chemotherapy prior to surgery, and then will receive radiation. Patient goes to [place] q3 weeks for chemotherapy, most recently on Wednesday. Patient received Taxol (had 1 of 3 total doses). Patient with no port at the moment, given recent infected port. Uses PICC line during chemo treatments.  -Kpad prn for MSK pain -continue home oxycodone 30mg  q12hrs prn  -will FYI oncologist   Hyponatremia Na of 131 on admission. Unclear cause. Patient euvolemic on exam. -NS @ 100 cc/hr -continue to monitor on daily BMET -consider further work up including urine Na if persistent  Anemia Hgb of 10.2 on admission. No prior CBC's for comparison on EMR or care everywhere. MCV high-normal at 97.5. Possibly secondary to chemotherapy. -continue to monitor on daily BMET -consider anemia panel  FEN/GI: regular diet Prophylaxis: lovenox  Disposition: admit to stepdown for further management  History of Present Illness:  Monica Poole is a 41 y.o. female presenting with pneumonia.  Patient reports that she went to [place], via airplane, on Wednesday. States that symptoms began the following Friday and began with a runny nose but progressively worsened by Sunday. Patient states mucous began to turn yellow and her chest began to get more tight. Patient has also been febrile since Sunday with Tmax of 102 at home. Patient called her PCP on Monday and was prescribed levaquin via telephone but that did not seem to help.   Patient has been using pulmicort breathing treatment,  but chest still felt tight. Patient went to urgent care this morning and was told to come to ED because of pneumonia  seen on CXR. Patient also had measured fever of 102 at UC despite two days of levaquin. Patient has been taking combivent inhaler more frequently, 4 times on Sunday and 4 times yesterday, with minimal relief. Taking oxy IR 30mg  q12H for bone pain.   Patient travels to [place] every 3 weeks via airplane. Patient does not take blood thinners. Patient denies calf pain or edema.   Patient seen in urgent care where CXR showed left basilar infiltrate. ED began abx on arrival for HCAP.   Review Of Systems: Per HPI with the following additions:   Review of Systems  Constitutional: Positive for chills and fever.  HENT: Positive for congestion. Negative for ear pain and tinnitus.   Eyes: Negative for blurred vision and double vision.  Respiratory: Positive for cough and wheezing. Negative for shortness of breath.   Cardiovascular: Negative for chest pain and leg swelling.  Gastrointestinal: Negative for abdominal pain, diarrhea, nausea and vomiting.  Genitourinary: Negative for dysuria.  Musculoskeletal: Positive for joint pain. Negative for falls.  Skin: Negative for rash.  Neurological: Negative for headaches.  Psychiatric/Behavioral: Negative for substance abuse.    Patient Active Problem List   Diagnosis Date Noted  . Pneumonia 10/16/2017    Past Medical History: Past Medical History:  Diagnosis Date  . Arthritis   . Cancer [facility])    breast    Past Surgical History: Past Surgical History:  Procedure Laterality Date  . CERVIX SURGERY      Social History: Social History   Tobacco Use  . Smoking status: Never Smoker  . Smokeless tobacco: Never Used  Substance Use Topics  . Alcohol use: No    Frequency: Never  . Drug use: No   Additional social history: lives with husband, no alcohol drugs or tobacco  Please also refer to relevant sections of EMR.  Family History: No family history on file. Dad's side- "heart problems, valve problems" Father- HTN Mother's side- MI's in  grandmother/grandfather  Allergies and Medications: Allergies  Allergen Reactions  . 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 Dicyclomine Other (See Comments)    Other reaction(s): Other (See Comments) CARDIAC ARREST CARDIAC ARREST   . Latex     Other reaction(s): Other (See Comments) BLISTERS BLISTERS   . Bentyl [Dicyclomine Hcl] Other (See Comments)    Cardiac arrest   No current facility-administered medications on file prior to encounter.    Current Outpatient Medications on File Prior to Encounter  Medication Sig Dispense Refill  . acetaminophen (TYLENOL) 500 MG tablet Take 1,000 mg by mouth every 6 (six) hours as needed for fever.    . budesonide (PULMICORT) 0.5 MG/2ML nebulizer solution Take 0.5 mg by nebulization 2 (two) times daily as needed (wheezing).    Marland Kitchen ibuprofen (ADVIL,MOTRIN) 200 MG tablet Take 600 mg by mouth every 6 (six) hours as needed for fever.    . Ipratropium-Albuterol (COMBIVENT RESPIMAT) 20-100 MCG/ACT AERS respimat Inhale 2 puffs into the lungs every 6 (six) hours as needed for wheezing.    Marland Kitchen levofloxacin (LEVAQUIN) 750 MG tablet Take 750 mg by mouth daily.  0  . oxycodone (ROXICODONE) 30 MG immediate release tablet Take 30 mg by mouth every 12 (twelve) hours.    Marland Kitchen PRESCRIPTION MEDICATION 1 each by Intrauterine route once. paraguard  Objective: BP (!) 94/53   Pulse 93   Temp 98.2 F (36.8 C) (Oral)   Resp 18   Ht 5\' 3"  (1.6 m)   Wt 165 lb (74.8 kg)   SpO2 100%   BMI 29.23 kg/m  Exam: General: Awake and alert, NAD, laying in bed  Eyes: PERRL ENTM: moist mucous membranes  Neck: supple, non tender, no LAD  Cardiovascular: RRR, no MRG, +2 radial pulses bilaterally  Respiratory: course breath sounds throughout, expiratory wheezing throughout, no increased work of breathing  Gastrointestinal: soft, non tender, non distended, bowel sounds normal  MSK: no edema or cyanosis, moves all extremities  equally Derm: no rashes, warm, dry  Neuro: no focal deficits, 5/5 strength in upper and lower extremities bilaterally  Psych: normal affect   Labs and Imaging: CBC BMET  Recent Labs  Lab 10/16/17 1305  WBC 5.5  HGB 10.2*  HCT 30.8*  PLT 254   Recent Labs  Lab 10/16/17 1305  NA 131*  K 4.0  CL 101  CO2 21*  BUN 11  CREATININE 0.76  GLUCOSE 114*  CALCIUM 9.0       Ref. Range 10/16/2017 13:37 10/16/2017 15:38  Lactic Acid, Venous Latest Ref Range: 0.5 - 1.9 mmol/L 2.52 ([facility]) 2.10 ([place])    Caroline More, DO 10/16/2017, 4:28 PM PGY-1, [place] Intern pager: [phone], text pages welcome   FPTS Upper-Level Resident Addendum  I have independently interviewed and examined the patient. I have discussed the above with the original author and agree with their documentation. My edits for correction/addition/clarification are in pink.Please see also any attending notes.   Lucila Maine, DO PGY-2, [place] Service pager: [phone] (text pages welcome through [place])

## 2017-10-16 NOTE — Progress Notes (Signed)
Pharmacy Antibiotic Note  Monica Poole is a 41 y.o. female admitted on 10/16/2017 with pneumonia.  Pharmacy has been consulted for vancomycin dosing.  Active breast cancer patient (last chemo tx Wed 12/12) sent from UC, patient-reported fevers of 102 over several days with productive cough, yellow sputum. Tmax 98.2, wbc 5.5, LA 2.5, Scr 0.76.  Plan: Vancomycin 1500 mg IV load x 1. Vancomcyin 750 mg IV q8 hours Continue cefepime 1 gram IV q 8 hours (per MD) Monitor C/S, renal function, clinical status, vanc trough as indicated F/u LOT/de-escalation plans  Height: 5\' 3"  (160 cm) Weight: 165 lb (74.8 kg) IBW/kg (Calculated) : 52.4  Temp (24hrs), Avg:98.2 F (36.8 C), Min:98.2 F (36.8 C), Max:98.2 F (36.8 C)  Recent Labs  Lab 10/16/17 1305 10/16/17 1337  WBC 5.5  --   CREATININE 0.76  --   LATICACIDVEN  --  2.52*    Estimated Creatinine Clearance: 91.5 mL/min (by C-G formula based on SCr of 0.76 mg/dL).    Allergies  Allergen Reactions  . Bentyl [Dicyclomine Hcl]     Antimicrobials this admission: cefepime 12/18 >>  vanc 12/18  >>    Microbiology results: 12/18 BCx: pending 12/18 Sputum: ordered    Thank you for allowing pharmacy to be a part of this patient's care.   Charlene Brooke, PharmD PGY1 Pharmacy Resident Main Pharmacy [phone] 10/16/2017 2:12 PM

## 2017-10-16 NOTE — ED Notes (Signed)
EDP aware of lactic

## 2017-10-16 NOTE — ED Triage Notes (Signed)
Cancer pt sent from UC for tx of PNA. Pt had xray and blood work at [facility]. PT reports fevers of 102 over the last couple of days. Yellow productive cough. Last chemo tx Wednesday.

## 2017-10-16 NOTE — Progress Notes (Signed)
Pt receiving breathing treatment with RT

## 2017-10-16 NOTE — Progress Notes (Signed)
CRITICAL VALUE ALERT  Critical Value:  Lactic acid  Date & Time Notied:  10/16/17 1932   Provider Notified: [phone]   Orders Received/Actions taken: 1L bolus ordered

## 2017-10-17 ENCOUNTER — Inpatient Hospital Stay (HOSPITAL_COMMUNITY): Payer: BLUE CROSS/BLUE SHIELD

## 2017-10-17 DIAGNOSIS — J45909 Unspecified asthma, uncomplicated: Secondary | ICD-10-CM

## 2017-10-17 DIAGNOSIS — J189 Pneumonia, unspecified organism: Secondary | ICD-10-CM

## 2017-10-17 DIAGNOSIS — R0602 Shortness of breath: Secondary | ICD-10-CM

## 2017-10-17 LAB — BASIC METABOLIC PANEL
Anion gap: 7 (ref 5–15)
BUN: 9 mg/dL (ref 6–20)
CO2: 20 mmol/L — ABNORMAL LOW (ref 22–32)
Calcium: 8 mg/dL — ABNORMAL LOW (ref 8.9–10.3)
Chloride: 110 mmol/L (ref 101–111)
Creatinine, Ser: 0.68 mg/dL (ref 0.44–1.00)
GFR calc Af Amer: >60 mL/min (ref >60)
GFR calc non Af Amer: >60 mL/min (ref >60)
Glucose, Bld: 166 mg/dL — ABNORMAL HIGH (ref 65–99)
Potassium: 3.4 mmol/L — ABNORMAL LOW (ref 3.5–5.1)
Sodium: 137 mmol/L (ref 135–145)

## 2017-10-17 LAB — CBC
HCT: 25.8 % — ABNORMAL LOW (ref 36.0–46.0)
Hemoglobin: 8.6 g/dL — ABNORMAL LOW (ref 12.0–15.0)
MCH: 32.7 pg (ref 26.0–34.0)
MCHC: 33.3 g/dL (ref 30.0–36.0)
MCV: 98.1 fL (ref 78.0–100.0)
Platelets: 231 10*3/uL (ref 150–400)
RBC: 2.63 MIL/uL — ABNORMAL LOW (ref 3.87–5.11)
RDW: 17.3 % — ABNORMAL HIGH (ref 11.5–15.5)
WBC: 3.1 10*3/uL — ABNORMAL LOW (ref 4.0–10.5)

## 2017-10-17 LAB — STREP PNEUMONIAE URINARY ANTIGEN: Strep Pneumo Urinary Antigen: NEGATIVE

## 2017-10-17 LAB — HIV ANTIBODY (ROUTINE TESTING W REFLEX)
HIV Screen 4th Generation wRfx: NONREACTIVE
HIV Screen 4th Generation wRfx: NONREACTIVE

## 2017-10-17 LAB — LACTIC ACID, PLASMA: Lactic Acid, Venous: 3.9 mmol/L (ref 0.5–1.9)

## 2017-10-17 MED ORDER — PREDNISONE 20 MG PO TABS
40.0000 mg | ORAL_TABLET | Freq: Every day | ORAL | Status: DC
  Administered 2017-10-17 – 2017-10-18 (×2): 40 mg via ORAL
  Filled 2017-10-17 (×3): qty 2

## 2017-10-17 MED ORDER — IPRATROPIUM-ALBUTEROL 0.5-2.5 (3) MG/3ML IN SOLN
3.0000 mL | Freq: Three times a day (TID) | RESPIRATORY_TRACT | Status: DC
  Administered 2017-10-17 – 2017-10-18 (×3): 3 mL via RESPIRATORY_TRACT
  Filled 2017-10-17 (×3): qty 3

## 2017-10-17 MED ORDER — IOPAMIDOL (ISOVUE-370) INJECTION 76%
INTRAVENOUS | Status: AC
  Administered 2017-10-17: 100 mL
  Filled 2017-10-17: qty 100

## 2017-10-17 MED ORDER — POTASSIUM CHLORIDE CRYS ER 20 MEQ PO TBCR
40.0000 meq | EXTENDED_RELEASE_TABLET | Freq: Once | ORAL | Status: AC
  Administered 2017-10-17: 40 meq via ORAL
  Filled 2017-10-17: qty 2

## 2017-10-17 MED ORDER — SODIUM CHLORIDE 0.9 % IV BOLUS (SEPSIS)
1000.0000 mL | Freq: Once | INTRAVENOUS | Status: AC
  Administered 2017-10-17: 1000 mL via INTRAVENOUS

## 2017-10-17 NOTE — Progress Notes (Signed)
Family Medicine Teaching Service Daily Progress Note Intern Pager: [phone]  Patient name: Monica Poole Medical record number: [id number] Date of birth: 03/26/1976 Age: 41 y.o. Gender: female  Primary Care Provider: [name], MD Consultants: none Code Status: DNR  Pt Overview and Major Events to Date:  Monica Poole is a 41 y.o. female presenting with shortness of breath and pneumonia found at [facility]. PMH is significant for breast cancer  Assessment and Plan:  Shortness of breath 2/2 HCAP: seen on outpatient CXR. Not seen on CXR here, will get CTA. Patient is immunocompromised and has had recent hospital exposure for chemotherapy.  Will stop trending LA as patient has malignancy and this can raise her LA. No leukocytosis, WBC of 3.1. -CTA this am to r/o PE -repeat 1L fluid bolus -continue vancomycin (12/18-) and cefepime (12/18-) for HCAP due to patient being  immunocomprosed -urine strep pneumonia antigen negative -treat asthma: duonebs q4hrs, albuterol and pulmicort prn  -s/p solumedrol 125mg , will start prednisone 40mg  for 4 more days due to patient's wheezing -Influenza swab negative- will d/c droplet precautions -CXR- Mild bronchitic changes. -supplemental O2 prn  -incentive spirometry q2hrs while awake  Asthma: Improving. Home meds: pulmicort prn, combivent as needed. Patient with no wheezing noted, just decreased air exchange. -albuterol prn -pulmicort -duonebs scheduled q4hrs  -s/p solumedrol 125mg , will start prednisone 40mg  for 4 more days due to patient's wheezing  Metastatic breast cancer: Diagnosed in June 2018. Patient currently on chemotherapy prior to surgery, and then will receive radiation. Patient goes to [place] q3 weeks for chemotherapy, most recently on Wednesday. Patient received Taxol (had 1 of 3 total doses). Patient with no port at the moment, given recent infected port. Uses PICC line during chemo treatments.  -Kpad prn for MSK pain -continue home  oxycodone 30mg  q12hrs prn  -will FYI oncologist   Hypokalemia: 3.4 K this am. -will give K-dur 40 meq -Monitor on BMP  Hyponatremia: Resolved. Na of 131>137. Patient euvolemic on exam. -IVF NS @ 100 cc/hr discontinued  -continue to monitor on daily BMET -consider further work up including urine Na if persistent  Anemia: Hgb of 10.2 on admission. No prior CBC's for comparison on EMR or care everywhere. MCV high-normal at 97.5. Possibly secondary to chemotherapy. -continue to monitor on daily BMET -consider anemia panel  FEN/GI: regular diet Prophylaxis: lovenox  Disposition: pending clinical improvement  Subjective:  Patient states that she feels better this morning. She is ready to go home whenever she can.   Objective: Temp:  [97.7 F (36.5 C)-98.2 F (36.8 C)] 98.2 F (36.8 C) (12/19 0519) Pulse Rate:  [78-115] 115 (12/19 0511) Resp:  [11-31] 18 (12/19 0511) BP: (90-111)/(48-67) 107/56 (12/19 0511) SpO2:  [94 %-100 %] 97 % (12/19 0511) Weight:  [165 lb (74.8 kg)] 165 lb (74.8 kg) (12/18 1242) Physical Exam: General: NAD, laying in bed Cardiovascular: RRR, no mrg Respiratory: CTAB with decreased air movement noted throughout, no wheezing, crackles noted Abdomen: nontender, nondistended, soft Extremities: moves all 4 extremities equally  Laboratory: Recent Labs  Lab 10/16/17 1305 10/17/17 0254  WBC 5.5 3.1*  HGB 10.2* 8.6*  HCT 30.8* 25.8*  PLT 254 231   Recent Labs  Lab 10/16/17 1305 10/17/17 0254  NA 131* 137  K 4.0 3.4*  CL 101 110  CO2 21* 20*  BUN 11 9  CREATININE 0.76 0.68  CALCIUM 9.0 8.0*  PROT 6.6  --   BILITOT 0.7  --   ALKPHOS 47  --  ALT 40  --   AST 33  --   GLUCOSE 114* 166*    Imaging/Diagnostic Tests: Dg Chest 2 View  Result Date: 10/16/2017 CLINICAL DATA:  Cough, chest congestion EXAM: CHEST  2 VIEW COMPARISON:  03/29/2017 FINDINGS: Mild peribronchial thickening. Heart and mediastinal contours are within normal limits. No  focal opacities or effusions. No acute bony abnormality. IMPRESSION: Mild bronchitic changes. Electronically Signed   By: Rolm Baptise M.D.   On: 10/16/2017 18:49    Ansel Ferrall, [place], DO 10/17/2017, 8:25 AM PGY-1, [place] Intern pager: [phone], text pages welcome

## 2017-10-18 LAB — BASIC METABOLIC PANEL
Anion gap: 6 (ref 5–15)
BUN: 10 mg/dL (ref 6–20)
CO2: 21 mmol/L — ABNORMAL LOW (ref 22–32)
Calcium: 8.2 mg/dL — ABNORMAL LOW (ref 8.9–10.3)
Chloride: 112 mmol/L — ABNORMAL HIGH (ref 101–111)
Creatinine, Ser: 0.68 mg/dL (ref 0.44–1.00)
GFR calc Af Amer: >60 mL/min (ref >60)
GFR calc non Af Amer: >60 mL/min (ref >60)
Glucose, Bld: 97 mg/dL (ref 65–99)
Potassium: 4.3 mmol/L (ref 3.5–5.1)
Sodium: 139 mmol/L (ref 135–145)

## 2017-10-18 LAB — CBC
HCT: 24.4 % — ABNORMAL LOW (ref 36.0–46.0)
Hemoglobin: 8.1 g/dL — ABNORMAL LOW (ref 12.0–15.0)
MCH: 33.2 pg (ref 26.0–34.0)
MCHC: 33.2 g/dL (ref 30.0–36.0)
MCV: 100 fL (ref 78.0–100.0)
Platelets: 218 10*3/uL (ref 150–400)
RBC: 2.44 MIL/uL — ABNORMAL LOW (ref 3.87–5.11)
RDW: 18.3 % — ABNORMAL HIGH (ref 11.5–15.5)
WBC: 3.3 10*3/uL — ABNORMAL LOW (ref 4.0–10.5)

## 2017-10-18 MED ORDER — PREDNISONE 20 MG PO TABS: ORAL_TABLET | ORAL | 0 refills | Status: DC

## 2017-10-18 MED ORDER — AZITHROMYCIN 250 MG PO TABS: 250.0000 mg | ORAL_TABLET | Freq: Every day | ORAL | 0 refills | Status: DC

## 2017-10-18 MED ORDER — AZITHROMYCIN 500 MG PO TABS
250.0000 mg | ORAL_TABLET | Freq: Every day | ORAL | Status: DC
  Administered 2017-10-18: 250 mg via ORAL
  Filled 2017-10-18: qty 1

## 2017-10-18 NOTE — Progress Notes (Signed)
Family Medicine Teaching Service Daily Progress Note Intern Pager: [phone]  Patient name: Monica Poole Medical record number: [id number] Date of birth: 05/30/1976 Age: 41 y.o. Gender: female  Primary Care Provider: [name], MD Consultants: none Code Status: DNR  Pt Overview and Major Events to Date:  Monica Poole is a 41 y.o. female presenting with shortness of breath and pneumonia found at [facility]. PMH is significant for breast cancer  Assessment and Plan:  Shortness of breath: Improved. Pneumonia seen on outpatient CXR. Not seen on CXR/CTA here. Patient is immunocompromised and has had recent hospital exposure for chemotherapy. Will ensure Blood Cx has NG for 48 hours prior to discharge which is 12/20 at 1300.  -CTA Negative for pulmonary embolism. Bronchitis with mucoid impaction, atelectasis, and patchy air trapping. No pneumonia. -Started azithromycin (12/20); vancomycin (12/18-20) and cefepime (12/18-20)  -CXR- Mild bronchitic changes. -supplemental O2 prn  -incentive spirometry q2hrs while awake  Asthma: Improving. Home meds: pulmicort prn, combivent as needed. Patient with wheezing noted on exam. -albuterol prn -pulmicort -duonebs scheduled q4hrs  -prednisone 40mg  for 3 more days due to patient's wheezing and then with taper due to her history  Metastatic breast cancer: Diagnosed in June 2018. Patient currently on chemotherapy prior to surgery, and then will receive radiation. Patient goes to [place] q3 weeks for chemotherapy, most recently on Wednesday. Patient received Taxol (had 1 of 3 total doses). Patient with no port at the moment, given recent infected port. Uses PICC line during chemo treatments.  -Kpad prn for MSK pain -continue home oxycodone 30mg  q12hrs prn   Hypokalemia: Resolved. 4.3 K this am. -Monitor on BMP  Anemia: Asymptomatic. Hgb of 10.2>8.1. MCV high-normal at 97.5. Possibly secondary to chemotherapy and dilution.  -continue to monitor on daily  CBC -Likely worsening due to dilutional effects from IVF  FEN/GI: regular diet Prophylaxis: lovenox  Disposition: pending clinical improvement  Subjective:  Patient is ready to go home to her girls and her dogs. She says she feels like this is an asthma flair and she is prepared to handle this at home because she has a long-standing history of asthma.   Objective: Temp:  [97.7 F (36.5 C)-98.4 F (36.9 C)] 97.7 F (36.5 C) (12/20 0451) Pulse Rate:  [77-110] 77 (12/20 0451) Resp:  [16-18] 18 (12/20 0451) BP: (94-116)/(52-79) 94/60 (12/20 0451) SpO2:  [95 %-100 %] 96 % (12/20 0755) Physical Exam: General: NAD, laying in bed Cardiovascular: RRR, no mrg Respiratory: CTAB with decreased air movement noted throughout, no wheezing, crackles noted Abdomen: nontender, nondistended, soft Extremities: moves all 4 extremities equally  Laboratory: Recent Labs  Lab 10/16/17 1305 10/17/17 0254 10/18/17 0230  WBC 5.5 3.1* 3.3*  HGB 10.2* 8.6* 8.1*  HCT 30.8* 25.8* 24.4*  PLT 254 231 218   Recent Labs  Lab 10/16/17 1305 10/17/17 0254 10/18/17 0230  NA 131* 137 139  K 4.0 3.4* 4.3  CL 101 110 112*  CO2 21* 20* 21*  BUN 11 9 10   CREATININE 0.76 0.68 0.68  CALCIUM 9.0 8.0* 8.2*  PROT 6.6  --   --   BILITOT 0.7  --   --   ALKPHOS 47  --   --   ALT 40  --   --   AST 33  --   --   GLUCOSE 114* 166* 97    Imaging/Diagnostic Tests: Dg Chest 2 View  Result Date: 10/17/2017 CLINICAL DATA:  Assistant cough since last night. Patient is currently undergoing  chemotherapy for breast malignancy. The patient has a history of pneumonia, nonsmoker. EXAM: CHEST  2 VIEW COMPARISON:  PA and lateral chest x-ray of October 16, 2017 FINDINGS: The lungs are adequately inflated. The interstitial markings remain coarse in the mid and lower lung zones. There is no alveolar infiltrate. There is no pleural effusion. The heart and pulmonary vascularity are normal. The mediastinum is normal in width.  There is no acute bony abnormality. IMPRESSION: Stable mild chronic bronchitic changes.  No alveolar pneumonia. Electronically Signed   By: David  [place] M.D.   On: 10/17/2017 08:47   Dg Chest 2 View  Result Date: 10/16/2017 CLINICAL DATA:  Cough, chest congestion EXAM: CHEST  2 VIEW COMPARISON:  03/29/2017 FINDINGS: Mild peribronchial thickening. Heart and mediastinal contours are within normal limits. No focal opacities or effusions. No acute bony abnormality. IMPRESSION: Mild bronchitic changes. Electronically Signed   By: Rolm Baptise M.D.   On: 10/16/2017 18:49   Ct Angio Chest Pe W Or Wo Contrast  Result Date: 10/17/2017 CLINICAL DATA:  Shortness of breath. Pneumonia reportedly seen at urgent care. EXAM: CT ANGIOGRAPHY CHEST WITH CONTRAST TECHNIQUE: Multidetector CT imaging of the chest was performed using the standard protocol during bolus administration of intravenous contrast. Multiplanar CT image reconstructions and MIPs were obtained to evaluate the vascular anatomy. CONTRAST:  149mL ISOVUE-370 IOPAMIDOL (ISOVUE-370) INJECTION 76% COMPARISON:  05/29/2017 CT FINDINGS: Cardiovascular: Satisfactory opacification of the pulmonary arteries to the segmental level. No evidence of pulmonary embolism. Normal heart size. No pericardial effusion. Mediastinum/Nodes: Negative for adenopathy or mass. Bilateral breast nodularity.  Recent breast cancer treatment. Lungs/Pleura: Diffuse airway thickening with mucoid impaction of segmental airways in the lower lobes. Mosaic attenuation of the lungs attributed to air trapping. There is also subsegmental atelectasis. 4 mm left upper lobe pulmonary nodule, stable from 05/29/2017 CT. No pneumonia or edema. Upper Abdomen: Negative Musculoskeletal: No acute or aggressive finding. Review of the MIP images confirms the above findings. IMPRESSION: 1. Negative for pulmonary embolism. 2. Bronchitis with mucoid impaction, atelectasis, and patchy air trapping. Electronically  Signed   By: Monte Fantasia M.D.   On: 10/17/2017 13:08     Tiki Tucciarone, [place], DO 10/18/2017, 8:47 AM PGY-1, [place] Intern pager: [phone], text pages welcome

## 2017-10-18 NOTE — Progress Notes (Signed)
Paged MD. Notified blood cultures remain negative at last resulted time today.

## 2017-10-18 NOTE — Discharge Instructions (Addendum)
You were admitted for increasing worsening shortness of breath. Your chest Xray on admission did not show pneumonia. It is likely bronchitis. Your antibiotic cultures showed no growth for 48 hours.  You will be sent home with azithromycin to take for a few days as well as a prednisone taper- Take 2 days of 40 mg (2 tabs) until 12/22.  Then take 5 days of 20 mg (1 tab) until 12/27.  Then take 5 days of 10 mg (0.5 tab) until 01/01.  Please follow up with your PCP within 1 week of discharge.  Please call your PCP or return if you have worsening shortness of breath or develop fever of greater than 100.4.

## 2017-10-18 NOTE — Discharge Summary (Signed)
[facility] Discharge Summary  Patient name: Monica Poole record number: [id number] Date of birth: 01/07/1976 Age: 41 y.o. Gender: female Date of Admission: 10/16/2017  Date of Discharge: 10/18/17  Admitting Physician: Kinnie Feil, MD  Primary Care Provider: [name], MD Consultants: none  Indication for Hospitalization: SOB  Discharge Diagnoses/Problem List:  Shortness of breath Bronchitis Asthma Metastatic Breast Cancer Anemia  Disposition: stable  Discharge Condition: home  Discharge Exam: see progress note from day of discharge  Brief Hospital Course:  This 41 year old female presented with pneumonia seen on CXR in urgent care.  Past medical history significant for metastatic breast cancer treated in [place].  Patient had failed Levaquin treatment and outpatient.  Also with a significant history of asthma. Patient was afebrile in ED, but had a recent administration of motrin in urgent care prior to admission where temperature was 102. Lactic acid was  elevated and trended to 2.52>2.10 and patient was given 2L of bolus and started on NS at 75cc/hr. Initial CXR in ED did not show any signs of pneumonia, however given history, patient was started on vancomycin  and cefepime for HCAP. No leukocytosis, WBC of 5.5. Blood cultures were drawn. Lactic Acid elevated likely due to metastatic disease.  CTA was obtained on following day due to increased tachycardia and tachypnea and her hypercoagulable state. CTA was negative for pulmonary embolism. Bronchitis with mucoid impaction, atelectasis, and patchy air trapping. No pneumonia. Vanc and cefepime were discontinued after blood cultures showed no growth for 48 hours given her immunosuppression. Azithromycin was started on day of discharge for patient to complete 5 day course of antibiotics for bronchitis. She was also sent home with a steroid taper given how short of breath and wheezy patient was  on exam. She reported this happening every winter.   Patient was doing much better with breathing upon discharge and instructed to follow up with her primary care physician.   Issues for Follow Up:  1. Patient sent home with total of 5 day therapy for bronchitis, she was to finish 3 more days of azithromycin.  2. Monitor patient's respiratory status. Sent home with prednisone taper:Take 2 days of 40 mg (2 tabs) until 12/22.  Then take 5 days of 20 mg (1 tab) until 12/27.  Then take 5 days of 10 mg (0.5 tab) until 01/01. She was also encouraged to continue using her inhaklers at home and return if shortness of breath worsened.   Significant Procedures: none  Significant Labs and Imaging:  Recent Labs  Lab 10/16/17 1305 10/17/17 0254 10/18/17 0230  WBC 5.5 3.1* 3.3*  HGB 10.2* 8.6* 8.1*  HCT 30.8* 25.8* 24.4*  PLT 254 231 218   Recent Labs  Lab 10/16/17 1305 10/17/17 0254 10/18/17 0230  NA 131* 137 139  K 4.0 3.4* 4.3  CL 101 110 112*  CO2 21* 20* 21*  GLUCOSE 114* 166* 97  BUN 11 9 10   CREATININE 0.76 0.68 0.68  CALCIUM 9.0 8.0* 8.2*  ALKPHOS 47  --   --   AST 33  --   --   ALT 40  --   --   ALBUMIN 3.8  --   --     Dg Chest 2 View  Result Date: 10/17/2017 CLINICAL DATA:  Assistant cough since last night. Patient is currently undergoing chemotherapy for breast malignancy. The patient has a history of pneumonia, nonsmoker. EXAM: CHEST  2 VIEW COMPARISON:  PA and lateral chest  x-ray of October 16, 2017 FINDINGS: The lungs are adequately inflated. The interstitial markings remain coarse in the mid and lower lung zones. There is no alveolar infiltrate. There is no pleural effusion. The heart and pulmonary vascularity are normal. The mediastinum is normal in width. There is no acute bony abnormality. IMPRESSION: Stable mild chronic bronchitic changes.  No alveolar pneumonia. Electronically Signed   By: David  [place] M.D.   On: 10/17/2017 08:47   Dg Chest 2 View  Result Date:  10/16/2017 CLINICAL DATA:  Cough, chest congestion EXAM: CHEST  2 VIEW COMPARISON:  03/29/2017 FINDINGS: Mild peribronchial thickening. Heart and mediastinal contours are within normal limits. No focal opacities or effusions. No acute bony abnormality. IMPRESSION: Mild bronchitic changes. Electronically Signed   By: Rolm Baptise M.D.   On: 10/16/2017 18:49   Ct Angio Chest Pe W Or Wo Contrast  Result Date: 10/17/2017 CLINICAL DATA:  Shortness of breath. Pneumonia reportedly seen at urgent care. EXAM: CT ANGIOGRAPHY CHEST WITH CONTRAST TECHNIQUE: Multidetector CT imaging of the chest was performed using the standard protocol during bolus administration of intravenous contrast. Multiplanar CT image reconstructions and MIPs were obtained to evaluate the vascular anatomy. CONTRAST:  135mL ISOVUE-370 IOPAMIDOL (ISOVUE-370) INJECTION 76% COMPARISON:  05/29/2017 CT FINDINGS: Cardiovascular: Satisfactory opacification of the pulmonary arteries to the segmental level. No evidence of pulmonary embolism. Normal heart size. No pericardial effusion. Mediastinum/Nodes: Negative for adenopathy or mass. Bilateral breast nodularity.  Recent breast cancer treatment. Lungs/Pleura: Diffuse airway thickening with mucoid impaction of segmental airways in the lower lobes. Mosaic attenuation of the lungs attributed to air trapping. There is also subsegmental atelectasis. 4 mm left upper lobe pulmonary nodule, stable from 05/29/2017 CT. No pneumonia or edema. Upper Abdomen: Negative Musculoskeletal: No acute or aggressive finding. Review of the MIP images confirms the above findings. IMPRESSION: 1. Negative for pulmonary embolism. 2. Bronchitis with mucoid impaction, atelectasis, and patchy air trapping. Electronically Signed   By: Monte Fantasia M.D.   On: 10/17/2017 13:08    Results/Tests Pending at Time of Discharge: none  Discharge Medications:  Allergies as of 10/18/2017      Reactions   Bee Venom Anaphylaxis   HAS EPI  PEN ALSO WASP HAS EPI PEN ALSO WASP   Dicyclomine Other (See Comments)   Other reaction(s): Other (See Comments) CARDIAC ARREST CARDIAC ARREST   Latex    Other reaction(s): Other (See Comments) BLISTERS BLISTERS   Bentyl [dicyclomine Hcl] Other (See Comments)   Cardiac arrest      Medication List    STOP taking these medications   levofloxacin 750 MG tablet Commonly known as:  LEVAQUIN     TAKE these medications   acetaminophen 500 MG tablet Commonly known as:  TYLENOL Take 1,000 mg by mouth every 6 (six) hours as needed for fever.   azithromycin 250 MG tablet Commonly known as:  ZITHROMAX Take 1 tablet (250 mg total) by mouth daily.   budesonide 0.5 MG/2ML nebulizer solution Commonly known as:  PULMICORT Take 0.5 mg by nebulization 2 (two) times daily as needed (wheezing).   COMBIVENT RESPIMAT 20-100 MCG/ACT Aers respimat Generic drug:  Ipratropium-Albuterol Inhale 2 puffs into the lungs every 6 (six) hours as needed for wheezing.   ibuprofen 200 MG tablet Commonly known as:  ADVIL,MOTRIN Take 600 mg by mouth every 6 (six) hours as needed for fever.   oxycodone 30 MG immediate release tablet Commonly known as:  ROXICODONE Take 30 mg by mouth every 12 (twelve) hours.  predniSONE 20 MG tablet Commonly known as:  DELTASONE Take 2 days of 40 mg (2 tabs) until 12/22.  Then take 5 days of 20 mg (1 tab) until 12/27.  Then take 5 days of 10 mg (0.5 tab) until 01/01.   PRESCRIPTION MEDICATION 1 each by Intrauterine route once. paraguard       Discharge Instructions: Please refer to Patient Instructions section of EMR for full details.  Patient was counseled important signs and symptoms that should prompt return to medical care, changes in medications, dietary instructions, activity restrictions, and follow up appointments.   Follow-Up Appointments: Follow-up Information    [name], MD. Schedule an appointment as soon as possible for a visit in 1 week(s).    Specialty:  Internal Medicine Why:  Please follow up within 1 week of discharge Contact information: [address] [place] [address] [phone]           Renley Gutman, [place], [place] 10/18/2017, 6:12 PM PGY-1, [place]

## 2017-10-21 LAB — CULTURE, BLOOD (ROUTINE X 2)
Culture: NO GROWTH
Culture: NO GROWTH
Special Requests: ADEQUATE
Special Requests: ADEQUATE

## 2017-10-30 HISTORY — PX: MASTECTOMY: SHX3

## 2017-11-08 ENCOUNTER — Ambulatory Visit (INDEPENDENT_AMBULATORY_CARE_PROVIDER_SITE_OTHER): Payer: BLUE CROSS/BLUE SHIELD | Admitting: Allergy

## 2017-11-08 ENCOUNTER — Encounter: Payer: Self-pay | Admitting: Allergy

## 2017-11-08 VITALS — BP 122/78 | HR 84 | Temp 98.2°F | Resp 18 | Ht 63.0 in | Wt 174.6 lb

## 2017-11-08 DIAGNOSIS — J309 Allergic rhinitis, unspecified: Secondary | ICD-10-CM

## 2017-11-08 DIAGNOSIS — Z91030 Bee allergy status: Secondary | ICD-10-CM | POA: Diagnosis not present

## 2017-11-08 DIAGNOSIS — J4540 Moderate persistent asthma, uncomplicated: Secondary | ICD-10-CM

## 2017-11-08 DIAGNOSIS — Z91038 Other insect allergy status: Secondary | ICD-10-CM

## 2017-11-08 DIAGNOSIS — H1010 Acute atopic conjunctivitis, unspecified eye: Secondary | ICD-10-CM | POA: Diagnosis not present

## 2017-11-08 MED ORDER — BUDESONIDE-FORMOTEROL FUMARATE 160-4.5 MCG/ACT IN AERO: 2.0000 | INHALATION_SPRAY | Freq: Two times a day (BID) | RESPIRATORY_TRACT | 5 refills | Status: DC

## 2017-11-08 MED ORDER — IPRATROPIUM-ALBUTEROL 0.5-2.5 (3) MG/3ML IN SOLN: 3.0000 mL | Freq: Four times a day (QID) | RESPIRATORY_TRACT | 5 refills | Status: DC | PRN

## 2017-11-08 MED ORDER — EPINEPHRINE 0.3 MG/0.3ML IJ SOAJ: 0.3000 mg | Freq: Once | INTRAMUSCULAR | 0 refills | Status: AC

## 2017-11-08 NOTE — Patient Instructions (Addendum)
Asthma    - improved control after treatment for pneumonia    - have access to combivent inhaler 2-3 puffs every 6 hours or nebulizer 1 vial every 4-6 hours as needed for cough/wheeze/shortness of breath/chest tightness.  May use 15-20 minutes prior to activity.   Monitor frequency of use.      - use Symbicort 160 2 puffs twice a day during winter season for maintenance of asthma symptoms    - will obtain total IgE level and CBC w diff to determine if you qualify for asthma biologic injectable medications for improved asthma control.     Asthma control goals:   Full participation in all desired activities (may need albuterol before activity)  Albuterol use two time or less a week on average (not counting use with activity)  Cough interfering with sleep two time or less a month  Oral steroids no more than once a year  No hospitalizations   Allergic rhinoconjunctivitis - environmental allergy testing today is positive to grasses, ragweed, trees, molds, dust mites, cat, dog and cockroach.  Allergen avoidance measures provided.   - continue Cetirizine twice a day - for nasal congestion/drainage may use OTC Flonase, Nasocort or Rhinocort 1-2 sprays each nostril daily - for itchy/watery/red eyes may use Pazeo or Pataday 1 drop each eye as needed daily  - allergen immunotherapy (allergy shots) benefits/risks and protocol discussed today.  Asthma needs to be under good control to undergo allergy shots  Stinging insect allergy - will obtain stinging insect panel  - have access to Epipen in case of sting and allergic reaction - you would benefit from venom immunotherapy due to severe previous reaction to stinging insect.  Informational packet provided   Follow-up 4 months or sooner if needed
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
## 2017-11-12 LAB — CBC WITH DIFFERENTIAL/PLATELET
Basophils Absolute: 0 10*3/uL (ref 0.0–0.2)
Basos: 0 %
EOS (ABSOLUTE): 0.2 10*3/uL (ref 0.0–0.4)
Eos: 4 %
Hematocrit: 40.6 % (ref 34.0–46.6)
Hemoglobin: 13.3 g/dL (ref 11.1–15.9)
Immature Grans (Abs): 0 10*3/uL (ref 0.0–0.1)
Immature Granulocytes: 0 %
Lymphocytes Absolute: 1.1 10*3/uL (ref 0.7–3.1)
Lymphs: 22 %
MCH: 32.1 pg (ref 26.6–33.0)
MCHC: 32.8 g/dL (ref 31.5–35.7)
MCV: 98 fL — ABNORMAL HIGH (ref 79–97)
Monocytes Absolute: 0.1 10*3/uL (ref 0.1–0.9)
Monocytes: 2 %
Neutrophils Absolute: 3.6 10*3/uL (ref 1.4–7.0)
Neutrophils: 72 %
Platelets: 213 10*3/uL (ref 150–379)
RBC: 4.14 x10E6/uL (ref 3.77–5.28)
RDW: 16.6 % — ABNORMAL HIGH (ref 12.3–15.4)
WBC: 5 10*3/uL (ref 3.4–10.8)

## 2017-11-12 LAB — IGE: IgE (Immunoglobulin E), Serum: 24 IU/mL (ref 0–100)

## 2017-11-12 LAB — ALLERGEN STINGING INSECT PANEL
Honeybee IgE: <0.1 kU/L
Hornet, White Face, IgE: <0.1 kU/L
Hornet, Yellow, IgE: <0.1 kU/L
Paper Wasp IgE: <0.1 kU/L
Yellow Jacket, IgE: <0.1 kU/L

## 2017-11-19 ENCOUNTER — Telehealth: Payer: Self-pay | Admitting: *Deleted

## 2017-11-19 NOTE — Telephone Encounter (Signed)
-----   Message from [place], MD sent at 11/16/2017  1:44 PM EST ----- Regarding: RE: IL-5 Thanks for reaching out first.  I think Morey Hummingbird may have misinterpreted how I resulted out the labs.    I just wanted pt to know that it looks like she would qualify for a biologic agent if her asthma was not controlled well with Symbicort alone and if we needed to step up therapy.   She does have a significant past history of poor asthma control.   She was on no maintenance medication when I saw her so seeing if symbicort alone will manage and if not will pursue biologic at which time I think she will have completed her chemo by follow-up visit.      ----- Message ----- From: Carin Hock, CMA Sent: 11/16/2017   9:05 AM To: Kennith Gain, MD Subject: IL-5                                           Hey just wanted to touch base before I contact patient regarding start biologic therapy.  I had seen where she is undergoing chemo and did not know if this is something that she should start now or after finishing chemo? Lesly Pontarelli

## 2017-11-19 NOTE — Telephone Encounter (Signed)
I talked to patient regarding IL-5 add-on therapy for her asthma.  She did indicate she is interested in possible therapy in future.  I did advise patient per Dr Nelva Bush to use daily Symbicort and when she follows up will see if this is something she may still need also she should be through with chemo then

## 2017-11-30 DIAGNOSIS — A419 Sepsis, unspecified organism: Secondary | ICD-10-CM

## 2017-11-30 HISTORY — DX: Sepsis, unspecified organism: A41.9

## 2017-12-03 ENCOUNTER — Emergency Department (HOSPITAL_COMMUNITY): Payer: BLUE CROSS/BLUE SHIELD

## 2017-12-03 ENCOUNTER — Encounter (HOSPITAL_COMMUNITY): Payer: Self-pay

## 2017-12-03 ENCOUNTER — Observation Stay (HOSPITAL_COMMUNITY)
Admission: EM | Admit: 2017-12-03 | Discharge: 2017-12-05 | Disposition: A | Discharge status: Home / Self Care | Payer: BLUE CROSS/BLUE SHIELD | Attending: Internal Medicine | Admitting: Internal Medicine

## 2017-12-03 ENCOUNTER — Other Ambulatory Visit: Payer: Self-pay

## 2017-12-03 DIAGNOSIS — J45909 Unspecified asthma, uncomplicated: Secondary | ICD-10-CM | POA: Diagnosis not present

## 2017-12-03 DIAGNOSIS — Z9221 Personal history of antineoplastic chemotherapy: Secondary | ICD-10-CM | POA: Insufficient documentation

## 2017-12-03 DIAGNOSIS — J9811 Atelectasis: Secondary | ICD-10-CM | POA: Insufficient documentation

## 2017-12-03 DIAGNOSIS — Z7952 Long term (current) use of systemic steroids: Secondary | ICD-10-CM | POA: Diagnosis not present

## 2017-12-03 DIAGNOSIS — C50511 Malignant neoplasm of lower-outer quadrant of right female breast: Secondary | ICD-10-CM | POA: Diagnosis present

## 2017-12-03 DIAGNOSIS — Z91040 Latex allergy status: Secondary | ICD-10-CM | POA: Diagnosis not present

## 2017-12-03 DIAGNOSIS — Z170 Estrogen receptor positive status [ER+]: Secondary | ICD-10-CM | POA: Diagnosis not present

## 2017-12-03 DIAGNOSIS — Z79899 Other long term (current) drug therapy: Secondary | ICD-10-CM | POA: Insufficient documentation

## 2017-12-03 DIAGNOSIS — Y95 Nosocomial condition: Secondary | ICD-10-CM | POA: Diagnosis not present

## 2017-12-03 DIAGNOSIS — N179 Acute kidney failure, unspecified: Secondary | ICD-10-CM | POA: Diagnosis not present

## 2017-12-03 DIAGNOSIS — J189 Pneumonia, unspecified organism: Secondary | ICD-10-CM | POA: Diagnosis not present

## 2017-12-03 DIAGNOSIS — R000 Tachycardia, unspecified: Secondary | ICD-10-CM | POA: Diagnosis not present

## 2017-12-03 DIAGNOSIS — Z8541 Personal history of malignant neoplasm of cervix uteri: Secondary | ICD-10-CM | POA: Diagnosis not present

## 2017-12-03 DIAGNOSIS — Z66 Do not resuscitate: Secondary | ICD-10-CM | POA: Diagnosis not present

## 2017-12-03 DIAGNOSIS — Z888 Allergy status to other drugs, medicaments and biological substances status: Secondary | ICD-10-CM | POA: Diagnosis not present

## 2017-12-03 DIAGNOSIS — A419 Sepsis, unspecified organism: Secondary | ICD-10-CM | POA: Diagnosis present

## 2017-12-03 DIAGNOSIS — M1990 Unspecified osteoarthritis, unspecified site: Secondary | ICD-10-CM | POA: Insufficient documentation

## 2017-12-03 DIAGNOSIS — Z91030 Bee allergy status: Secondary | ICD-10-CM | POA: Insufficient documentation

## 2017-12-03 DIAGNOSIS — C50912 Malignant neoplasm of unspecified site of left female breast: Secondary | ICD-10-CM | POA: Insufficient documentation

## 2017-12-03 DIAGNOSIS — C50919 Malignant neoplasm of unspecified site of unspecified female breast: Secondary | ICD-10-CM | POA: Diagnosis present

## 2017-12-03 DIAGNOSIS — C50411 Malignant neoplasm of upper-outer quadrant of right female breast: Secondary | ICD-10-CM | POA: Diagnosis present

## 2017-12-03 DIAGNOSIS — A4189 Other specified sepsis: Principal | ICD-10-CM | POA: Insufficient documentation

## 2017-12-03 DIAGNOSIS — C779 Secondary and unspecified malignant neoplasm of lymph node, unspecified: Secondary | ICD-10-CM | POA: Diagnosis not present

## 2017-12-03 DIAGNOSIS — R509 Fever, unspecified: Secondary | ICD-10-CM

## 2017-12-03 HISTORY — DX: Sepsis, unspecified organism: A41.9

## 2017-12-03 LAB — CBC WITH DIFFERENTIAL/PLATELET
Basophils Absolute: 0 10*3/uL (ref 0.0–0.1)
Basophils Relative: 0 %
Eosinophils Absolute: 0 10*3/uL (ref 0.0–0.7)
Eosinophils Relative: 0 %
HCT: 38.1 % (ref 36.0–46.0)
Hemoglobin: 12.2 g/dL (ref 12.0–15.0)
Lymphocytes Relative: 7 %
Lymphs Abs: 1 10*3/uL (ref 0.7–4.0)
MCH: 31.7 pg (ref 26.0–34.0)
MCHC: 32 g/dL (ref 30.0–36.0)
MCV: 99 fL (ref 78.0–100.0)
Monocytes Absolute: 0.7 10*3/uL (ref 0.1–1.0)
Monocytes Relative: 5 %
Neutro Abs: 12.4 10*3/uL — ABNORMAL HIGH (ref 1.7–7.7)
Neutrophils Relative %: 88 %
Platelets: 241 10*3/uL (ref 150–400)
RBC: 3.85 MIL/uL — ABNORMAL LOW (ref 3.87–5.11)
RDW: 13.6 % (ref 11.5–15.5)
WBC: 14.1 10*3/uL — ABNORMAL HIGH (ref 4.0–10.5)

## 2017-12-03 LAB — COMPREHENSIVE METABOLIC PANEL
ALT: 29 U/L (ref 14–54)
AST: 25 U/L (ref 15–41)
Albumin: 3.7 g/dL (ref 3.5–5.0)
Alkaline Phosphatase: 49 U/L (ref 38–126)
Anion gap: 11 (ref 5–15)
BUN: 17 mg/dL (ref 6–20)
CO2: 24 mmol/L (ref 22–32)
Calcium: 9.3 mg/dL (ref 8.9–10.3)
Chloride: 101 mmol/L (ref 101–111)
Creatinine, Ser: 1.04 mg/dL — ABNORMAL HIGH (ref 0.44–1.00)
GFR calc Af Amer: >60 mL/min (ref >60)
GFR calc non Af Amer: >60 mL/min (ref >60)
Glucose, Bld: 116 mg/dL — ABNORMAL HIGH (ref 65–99)
Potassium: 3.9 mmol/L (ref 3.5–5.1)
Sodium: 136 mmol/L (ref 135–145)
Total Bilirubin: 0.6 mg/dL (ref 0.3–1.2)
Total Protein: 6.3 g/dL — ABNORMAL LOW (ref 6.5–8.1)

## 2017-12-03 LAB — INFLUENZA PANEL BY PCR (TYPE A & B)
Influenza A By PCR: NEGATIVE
Influenza B By PCR: NEGATIVE

## 2017-12-03 LAB — I-STAT CG4 LACTIC ACID, ED: Lactic Acid, Venous: 1.82 mmol/L (ref 0.5–1.9)

## 2017-12-03 LAB — I-STAT BETA HCG BLOOD, ED (MC, WL, AP ONLY): I-stat hCG, quantitative: 7.5 m[IU]/mL — ABNORMAL HIGH (ref <5)

## 2017-12-03 MED ORDER — OXYCODONE-ACETAMINOPHEN 5-325 MG PO TABS
1.0000 | ORAL_TABLET | Freq: Once | ORAL | Status: AC
  Administered 2017-12-03: 1 via ORAL
  Filled 2017-12-03: qty 1

## 2017-12-03 NOTE — ED Triage Notes (Signed)
Per Pt, Pt is coming from home with complaints of right breast pain that started after she turned over in the bed. Pt has hx of chemo patient due to breast cancer in her right breast. Noted to have chemo one week ago. Reports fever this morning at 101.7. Took Tylenol that lowered temperature.

## 2017-12-04 ENCOUNTER — Encounter (HOSPITAL_COMMUNITY): Payer: Self-pay | Admitting: Internal Medicine

## 2017-12-04 ENCOUNTER — Other Ambulatory Visit: Payer: Self-pay

## 2017-12-04 ENCOUNTER — Observation Stay (HOSPITAL_COMMUNITY): Payer: BLUE CROSS/BLUE SHIELD

## 2017-12-04 DIAGNOSIS — A419 Sepsis, unspecified organism: Secondary | ICD-10-CM | POA: Diagnosis not present

## 2017-12-04 DIAGNOSIS — J189 Pneumonia, unspecified organism: Secondary | ICD-10-CM | POA: Diagnosis not present

## 2017-12-04 DIAGNOSIS — C50411 Malignant neoplasm of upper-outer quadrant of right female breast: Secondary | ICD-10-CM | POA: Diagnosis present

## 2017-12-04 DIAGNOSIS — C50919 Malignant neoplasm of unspecified site of unspecified female breast: Secondary | ICD-10-CM | POA: Diagnosis present

## 2017-12-04 DIAGNOSIS — J45909 Unspecified asthma, uncomplicated: Secondary | ICD-10-CM

## 2017-12-04 LAB — PROTIME-INR
INR: 1.2
Prothrombin Time: 15.1 seconds (ref 11.4–15.2)

## 2017-12-04 LAB — CBC WITH DIFFERENTIAL/PLATELET
Basophils Absolute: 0 10*3/uL (ref 0.0–0.1)
Basophils Relative: 0 %
Eosinophils Absolute: 0 10*3/uL (ref 0.0–0.7)
Eosinophils Relative: 0 %
HCT: 34.7 % — ABNORMAL LOW (ref 36.0–46.0)
Hemoglobin: 11.1 g/dL — ABNORMAL LOW (ref 12.0–15.0)
Lymphocytes Relative: 6 %
Lymphs Abs: 0.8 10*3/uL (ref 0.7–4.0)
MCH: 31.7 pg (ref 26.0–34.0)
MCHC: 32 g/dL (ref 30.0–36.0)
MCV: 99.1 fL (ref 78.0–100.0)
Monocytes Absolute: 0.8 10*3/uL (ref 0.1–1.0)
Monocytes Relative: 6 %
Neutro Abs: 11 10*3/uL — ABNORMAL HIGH (ref 1.7–7.7)
Neutrophils Relative %: 88 %
Platelets: 201 10*3/uL (ref 150–400)
RBC: 3.5 MIL/uL — ABNORMAL LOW (ref 3.87–5.11)
RDW: 13.7 % (ref 11.5–15.5)
WBC: 12.6 10*3/uL — ABNORMAL HIGH (ref 4.0–10.5)

## 2017-12-04 LAB — BASIC METABOLIC PANEL
Anion gap: 11 (ref 5–15)
BUN: 8 mg/dL (ref 6–20)
CO2: 17 mmol/L — ABNORMAL LOW (ref 22–32)
Calcium: 8.8 mg/dL — ABNORMAL LOW (ref 8.9–10.3)
Chloride: 111 mmol/L (ref 101–111)
Creatinine, Ser: 0.79 mg/dL (ref 0.44–1.00)
GFR calc Af Amer: >60 mL/min (ref >60)
GFR calc non Af Amer: >60 mL/min (ref >60)
Glucose, Bld: 161 mg/dL — ABNORMAL HIGH (ref 65–99)
Potassium: 4 mmol/L (ref 3.5–5.1)
Sodium: 139 mmol/L (ref 135–145)

## 2017-12-04 LAB — CBC
HCT: 37.7 % (ref 36.0–46.0)
Hemoglobin: 12 g/dL (ref 12.0–15.0)
MCH: 31.4 pg (ref 26.0–34.0)
MCHC: 31.8 g/dL (ref 30.0–36.0)
MCV: 98.7 fL (ref 78.0–100.0)
Platelets: 234 10*3/uL (ref 150–400)
RBC: 3.82 MIL/uL — ABNORMAL LOW (ref 3.87–5.11)
RDW: 13.6 % (ref 11.5–15.5)
WBC: 13.1 10*3/uL — ABNORMAL HIGH (ref 4.0–10.5)

## 2017-12-04 LAB — COMPREHENSIVE METABOLIC PANEL
ALT: 24 U/L (ref 14–54)
AST: 20 U/L (ref 15–41)
Albumin: 2.8 g/dL — ABNORMAL LOW (ref 3.5–5.0)
Alkaline Phosphatase: 43 U/L (ref 38–126)
Anion gap: 11 (ref 5–15)
BUN: 7 mg/dL (ref 6–20)
CO2: 19 mmol/L — ABNORMAL LOW (ref 22–32)
Calcium: 7.9 mg/dL — ABNORMAL LOW (ref 8.9–10.3)
Chloride: 108 mmol/L (ref 101–111)
Creatinine, Ser: 0.82 mg/dL (ref 0.44–1.00)
GFR calc Af Amer: >60 mL/min (ref >60)
GFR calc non Af Amer: >60 mL/min (ref >60)
Glucose, Bld: 124 mg/dL — ABNORMAL HIGH (ref 65–99)
Potassium: 3.3 mmol/L — ABNORMAL LOW (ref 3.5–5.1)
Sodium: 138 mmol/L (ref 135–145)
Total Bilirubin: 0.8 mg/dL (ref 0.3–1.2)
Total Protein: 5.2 g/dL — ABNORMAL LOW (ref 6.5–8.1)

## 2017-12-04 LAB — URINALYSIS, ROUTINE W REFLEX MICROSCOPIC
Bilirubin Urine: NEGATIVE
Glucose, UA: NEGATIVE mg/dL
Hgb urine dipstick: NEGATIVE
Ketones, ur: NEGATIVE mg/dL
Leukocytes, UA: NEGATIVE
Nitrite: NEGATIVE
Protein, ur: NEGATIVE mg/dL
Specific Gravity, Urine: 1.01 (ref 1.005–1.030)
pH: 6 (ref 5.0–8.0)

## 2017-12-04 LAB — PROCALCITONIN
Procalcitonin: 0.61 ng/mL
Procalcitonin: 0.53 ng/mL

## 2017-12-04 LAB — LACTIC ACID, PLASMA
Lactic Acid, Venous: 1 mmol/L (ref 0.5–1.9)
Lactic Acid, Venous: 1.7 mmol/L (ref 0.5–1.9)

## 2017-12-04 LAB — I-STAT CG4 LACTIC ACID, ED: Lactic Acid, Venous: 2.58 mmol/L (ref 0.5–1.9)

## 2017-12-04 LAB — APTT: aPTT: 34 seconds (ref 24–36)

## 2017-12-04 LAB — PREGNANCY, URINE: Preg Test, Ur: NEGATIVE

## 2017-12-04 LAB — GLUCOSE, CAPILLARY: Glucose-Capillary: 131 mg/dL — ABNORMAL HIGH (ref 65–99)

## 2017-12-04 MED ORDER — PIPERACILLIN-TAZOBACTAM 3.375 G IVPB 30 MIN
3.3750 g | Freq: Once | INTRAVENOUS | Status: AC
  Administered 2017-12-04: 3.375 g via INTRAVENOUS
  Filled 2017-12-04: qty 50

## 2017-12-04 MED ORDER — SODIUM CHLORIDE 0.9 % IV BOLUS (SEPSIS)
500.0000 mL | Freq: Once | INTRAVENOUS | Status: AC
  Administered 2017-12-04: 500 mL via INTRAVENOUS

## 2017-12-04 MED ORDER — ACETAMINOPHEN 325 MG PO TABS
650.0000 mg | ORAL_TABLET | Freq: Four times a day (QID) | ORAL | Status: DC | PRN
  Filled 2017-12-04: qty 2

## 2017-12-04 MED ORDER — SODIUM CHLORIDE 0.9 % IV SOLN
INTRAVENOUS | Status: AC
  Administered 2017-12-04 (×3): via INTRAVENOUS

## 2017-12-04 MED ORDER — PREDNISONE 20 MG PO TABS
20.0000 mg | ORAL_TABLET | Freq: Every day | ORAL | Status: DC
  Administered 2017-12-04 – 2017-12-05 (×2): 20 mg via ORAL
  Filled 2017-12-04 (×2): qty 1

## 2017-12-04 MED ORDER — FENTANYL CITRATE (PF) 100 MCG/2ML IJ SOLN
50.0000 ug | INTRAMUSCULAR | Status: DC | PRN
  Administered 2017-12-04 – 2017-12-05 (×3): 50 ug via INTRAVENOUS
  Filled 2017-12-04 (×3): qty 2

## 2017-12-04 MED ORDER — IPRATROPIUM BROMIDE 0.02 % IN SOLN: 0.5000 mg | RESPIRATORY_TRACT | Status: DC | PRN

## 2017-12-04 MED ORDER — VANCOMYCIN HCL IN DEXTROSE 750-5 MG/150ML-% IV SOLN
750.0000 mg | Freq: Two times a day (BID) | INTRAVENOUS | Status: DC
  Administered 2017-12-04 – 2017-12-05 (×3): 750 mg via INTRAVENOUS
  Filled 2017-12-04 (×5): qty 150

## 2017-12-04 MED ORDER — ONDANSETRON HCL 4 MG PO TABS: 4.0000 mg | ORAL_TABLET | Freq: Four times a day (QID) | ORAL | Status: DC | PRN

## 2017-12-04 MED ORDER — ONDANSETRON HCL 4 MG/2ML IJ SOLN: 4.0000 mg | Freq: Four times a day (QID) | INTRAMUSCULAR | Status: DC | PRN

## 2017-12-04 MED ORDER — HYDROMORPHONE HCL 1 MG/ML IJ SOLN
1.0000 mg | Freq: Once | INTRAMUSCULAR | Status: AC
  Administered 2017-12-04: 1 mg via INTRAVENOUS
  Filled 2017-12-04: qty 1

## 2017-12-04 MED ORDER — ENOXAPARIN SODIUM 40 MG/0.4ML ~~LOC~~ SOLN
40.0000 mg | SUBCUTANEOUS | Status: DC
  Filled 2017-12-04: qty 0.4

## 2017-12-04 MED ORDER — PIPERACILLIN-TAZOBACTAM 3.375 G IVPB
3.3750 g | Freq: Three times a day (TID) | INTRAVENOUS | Status: DC
  Administered 2017-12-04 – 2017-12-05 (×4): 3.375 g via INTRAVENOUS
  Filled 2017-12-04 (×7): qty 50

## 2017-12-04 MED ORDER — ACETAMINOPHEN 650 MG RE SUPP: 650.0000 mg | Freq: Four times a day (QID) | RECTAL | Status: DC | PRN

## 2017-12-04 MED ORDER — HYDROCORTISONE NA SUCCINATE PF 100 MG IJ SOLR
50.0000 mg | Freq: Once | INTRAMUSCULAR | Status: AC
Start: 2017-12-04 — End: 2017-12-04
  Administered 2017-12-04: 50 mg via INTRAVENOUS
  Filled 2017-12-04: qty 2

## 2017-12-04 MED ORDER — SODIUM CHLORIDE 0.9 % IV BOLUS (SEPSIS)
1000.0000 mL | Freq: Once | INTRAVENOUS | Status: AC
  Administered 2017-12-04: 1000 mL via INTRAVENOUS

## 2017-12-04 MED ORDER — MOMETASONE FURO-FORMOTEROL FUM 200-5 MCG/ACT IN AERO
2.0000 | INHALATION_SPRAY | Freq: Two times a day (BID) | RESPIRATORY_TRACT | Status: DC
  Administered 2017-12-04 – 2017-12-05 (×2): 2 via RESPIRATORY_TRACT
  Filled 2017-12-04 (×2): qty 8.8

## 2017-12-04 MED ORDER — ALBUTEROL SULFATE (2.5 MG/3ML) 0.083% IN NEBU: 2.5000 mg | INHALATION_SOLUTION | RESPIRATORY_TRACT | Status: DC | PRN

## 2017-12-04 MED ORDER — SODIUM CHLORIDE 0.9 % IV SOLN
Freq: Once | INTRAVENOUS | Status: AC
  Administered 2017-12-04: 04:00:00 via INTRAVENOUS

## 2017-12-04 MED ORDER — VANCOMYCIN HCL IN DEXTROSE 1-5 GM/200ML-% IV SOLN
1000.0000 mg | Freq: Once | INTRAVENOUS | Status: AC
  Administered 2017-12-04: 1000 mg via INTRAVENOUS
  Filled 2017-12-04: qty 200

## 2017-12-04 MED ORDER — ACETAMINOPHEN 325 MG PO TABS
650.0000 mg | ORAL_TABLET | Freq: Once | ORAL | Status: AC
  Administered 2017-12-04: 650 mg via ORAL
  Filled 2017-12-04: qty 2

## 2017-12-04 MED ORDER — IOPAMIDOL (ISOVUE-370) INJECTION 76%
INTRAVENOUS | Status: AC
  Administered 2017-12-04: 100 mL
  Filled 2017-12-04: qty 100

## 2017-12-04 NOTE — ED Notes (Signed)
Pt states that she is unable to void at this time cause she went before she came back to the room. Pt asked to let us know when she feels like she can go.

## 2017-12-04 NOTE — ED Notes (Signed)
Patient transported to CT scan . 
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
## 2017-12-04 NOTE — Progress Notes (Signed)
PROGRESS NOTE    Patient: Monica Poole     PCP: [name], MD                    DOB: Apr 07, 1976            DOA: 12/03/2017 EUM:[id number]             DOS: 12/04/2017, 11:22 AM   Date of Service: the patient was seen and examined on 12/04/2017 Subjective:   Patient was seen and examined this morning.  She is reporting she is feeling much better.  Denies any chest pain, shortness of breath.  Reporting improved fever and chills.  ----------------------------------------------------------------------------------------------------------------------  Brief Narrative:   Monica Poole is a 41 year old female with extensive history of breast cancer, asthma,, on chemotherapy, last therapy last week.  Patient travels to [facility] with [name].  Presented with fever, chills, right-sided pleuritic type chest pain for the past 3 weeks.  Patient reports of having nonproductive cough.  Also reports her sick contact was her son who has been screened for flu negative also strep throat negative. Was admitted overnight as patient met sepsis criteria, febrile with a temp of 102, leukocytosis, tachycardia, lactic acidosis.  CT angiogram of chest was reviewed reporting some consolidation otherwise normal.  Patient was admitted, initiated on IV fluid resuscitation, broad-spectrum antibiotics of Vanco and Zosyn, cultures were obtained    Principal Problem:   Sepsis ([place]) Active Problems:   HCAP (healthcare-associated pneumonia)   Breast cancer ([place])   Asthma   Assessment & Plan:    Sepsis -possibly secondary to developing pneumonia-  Symptoms are much improving, continue IV fluid hydration,  has been started on vancomycin and Zosyn we will continue Will follow with blood cultures. Much improved lactic acid, leukocytosis, As patient is on chemotherapy, treatment as above  possibly immunocompromised, will continue aggressive  Influenza screen negative  History of Asthma -  stable, not  wheezing, no complaint of shortness of breath, currently on room air.    Breast cancer - being followed by oncologist.  Patient is on chronic steroids prednisone 20 mg p.o. daily.  S/p1 dose of hydrocortisone at stress dose.   Acute renal failure -monitor BUN/creatinine, improving likely from developing sepsis and possible poor oral intake -follow metabolic panel after hydration.  UA is unremarkable.   DVT prophylaxis: Lovenox. Code Status: DNR. Family Communication: Patient's husband. Disposition Plan: Home. Consults called: None. Admission status: Inpatient.   Consultants:   None   Procedures:  No admission procedures for hospital encounter.     Antimicrobials:  Anti-infectives (From admission, onward)   Start     Dose/Rate Route Frequency Ordered Stop   12/04/17 0800  vancomycin (VANCOCIN) IVPB 750 mg/150 ml premix     750 mg 150 mL/hr over 60 Minutes Intravenous Every 12 hours 12/04/17 0548     12/04/17 0800  piperacillin-tazobactam (ZOSYN) IVPB 3.375 g     3.375 g 12.5 mL/hr over 240 Minutes Intravenous Every 8 hours 12/04/17 0548     12/04/17 0115  piperacillin-tazobactam (ZOSYN) IVPB 3.375 g     3.375 g 100 mL/hr over 30 Minutes Intravenous  Once 12/04/17 0111 12/04/17 0213   12/04/17 0115  vancomycin (VANCOCIN) IVPB 1000 mg/200 mL premix     1,000 mg 200 mL/hr over 60 Minutes Intravenous  Once 12/04/17 0111 12/04/17 0246        Objective: Vitals:   12/04/17 1000 12/04/17 1015 12/04/17 1030 12/04/17 1045  BP: 120/90  120/74 123/75 133/75  Pulse: 98 98 98 (!) 103  Resp: _0 (!) 21  Temp:      TempSrc:      SpO2: 97% 97% 98% 99%  Weight:      Height:        Intake/Output Summary (Last 24 hours) at 12/04/2017 1122 Last data filed at 12/04/2017 7124 Gross per 24 hour  Intake 5550 ml  Output -  Net 5550 ml   Filed Weights   12/03/17 1804 12/03/17 1821 12/04/17 0114  Weight: 78 kg (172 lb) 78 kg (172 lb) 78 kg (172 lb)     Examination:  General exam: Appears calm and comfortable  Respiratory system: Clear to auscultation. Respiratory effort normal. Cardiovascular system: S1 & S2 heard, RRR. No JVD, murmurs, rubs, gallops or clicks. No pedal edema. Gastrointestinal system: Abdomen is nondistended, soft and nontender. No organomegaly or masses felt. Normal bowel sounds heard. Central nervous system: Alert and oriented. No focal neurological deficits. Extremities: Symmetric 5 x 5 power. Skin: No rashes, lesions or ulcers Psychiatry: Judgement and insight appear normal. Mood & affect appropriate.     Data Reviewed: I have personally reviewed following labs and imaging studies  CBC: Recent Labs  Lab 12/03/17 1843 12/04/17 0539  WBC 14.1* 12.6*  NEUTROABS 12.4* 11.0*  HGB 12.2 11.1*  HCT 38.1 34.7*  MCV 99.0 99.1  PLT 241 580   Basic Metabolic Panel: Recent Labs  Lab 12/03/17 1843 12/04/17 0539  NA 136 138  K 3.9 3.3*  CL 101 108  CO2 24 19*  GLUCOSE 116* 124*  BUN 17 7  CREATININE 1.04* 0.82  CALCIUM 9.3 7.9*   GFR: Estimated Creatinine Clearance: 89.1 mL/min (by C-G formula based on SCr of 0.82 mg/dL). Liver Function Tests: Recent Labs  Lab 12/03/17 1843 12/04/17 0539  AST 25 20  ALT 29 24  ALKPHOS 49 43  BILITOT 0.6 0.8  PROT 6.3* 5.2*  ALBUMIN 3.7 2.8*   No results for input(s): LIPASE, AMYLASE in the last 168 hours. No results for input(s): AMMONIA in the last 168 hours. Coagulation Profile: Recent Labs  Lab 12/04/17 0539  INR 1.20   Cardiac Enzymes: No results for input(s): CKTOTAL, CKMB, CKMBINDEX, TROPONINI in the last 168 hours. BNP (last 3 results) No results for input(s): PROBNP in the last 8760 hours. HbA1C: No results for input(s): HGBA1C in the last 72 hours. CBG: No results for input(s): GLUCAP in the last 168 hours. Lipid Profile: No results for input(s): CHOL, HDL, LDLCALC, TRIG, CHOLHDL, LDLDIRECT in the last 72 hours. Thyroid Function  Tests: No results for input(s): TSH, T4TOTAL, FREET4, T3FREE, THYROIDAB in the last 72 hours. Anemia Panel: No results for input(s): VITAMINB12, FOLATE, FERRITIN, TIBC, IRON, RETICCTPCT in the last 72 hours. Sepsis Labs: Recent Labs  Lab 12/03/17 1916 12/04/17 0205 12/04/17 0539 12/04/17 0839  PROCALCITON  --   --  0.61  --   LATICACIDVEN 1.82 2.58* 1.0 1.7    No results found for this or any previous visit (from the past 240 hour(s)).     Radiology Studies: Dg Chest 2 View  Result Date: 12/03/2017 CLINICAL DATA:  Nausea and vomiting. EXAM: CHEST  2 VIEW COMPARISON:  Chest CT and chest radiograph October 17, 2017 FINDINGS: There is no demonstrable nasogastric tube. There is atelectatic change in the lung bases. Lungs elsewhere clear. Heart size and pulmonary vascularity are normal. No adenopathy. No bone lesions. IMPRESSION: Bibasilar atelectasis. No frank edema or consolidation. Heart  size normal. Note that there is no demonstrable nasogastric tube. Electronically Signed   By: Lowella Grip III M.D.   On: 12/03/2017 19:17   Ct Angio Chest Pe W Or Wo Contrast  Result Date: 12/04/2017 CLINICAL DATA:  Acute onset of right-sided chest pain and fever. EXAM: CT ANGIOGRAPHY CHEST WITH CONTRAST TECHNIQUE: Multidetector CT imaging of the chest was performed using the standard protocol during bolus administration of intravenous contrast. Multiplanar CT image reconstructions and MIPs were obtained to evaluate the vascular anatomy. CONTRAST:  146m ISOVUE-370 IOPAMIDOL (ISOVUE-370) INJECTION 76% COMPARISON:  CTA of the chest performed 10/17/2017, and chest radiograph performed 12/03/2017 FINDINGS: Cardiovascular:  There is no evidence of pulmonary embolus. The heart is normal in size. The thoracic aorta is grossly unremarkable. The great vessels are within normal limits. Mediastinum/Nodes: The mediastinum is unremarkable in appearance. No mediastinal lymphadenopathy is seen. No pericardial effusion  is identified. The thyroid gland is unremarkable. No axillary lymphadenopathy is appreciated. Lungs/Pleura: There is new consolidation at the central right middle lobe, concerning for pneumonia. Underlying bibasilar atelectasis is noted. No pleural effusion or pneumothorax is seen. No dominant mass is identified. Upper Abdomen: The visualized portions of the liver and spleen are unremarkable. The visualized portions of the adrenal glands and kidneys are within normal limits. Musculoskeletal: No acute osseous abnormalities are identified. The visualized musculature is unremarkable in appearance. A 2.1 cm nodule is suggested at the medial aspect of the left breast. This may have increased mildly in size from July 2018. Review of the MIP images confirms the above findings. IMPRESSION: 1. No evidence of pulmonary embolus. 2. New consolidation at the central right middle lung lobe, concerning for pneumonia. Underlying bibasilar atelectasis noted. 3. 2.1 cm nodule at the medial aspect of the left breast. This may have increased mildly in size from July 2018. Would correlate with prior mammogram, and consider further evaluation as deemed clinically appropriate. Electronically Signed   By: JGarald BaldingM.D.   On: 12/04/2017 05:24    Scheduled Meds: . mometasone-formoterol  2 puff Inhalation BID  . predniSONE  20 mg Oral Q breakfast   Continuous Infusions: . sodium chloride 125 mL/hr at 12/04/17 0615  . piperacillin-tazobactam (ZOSYN)  IV 3.375 g (12/04/17 0953)  . vancomycin Stopped (12/04/17 1107)     LOS: 0 days    Time spent: >25 minutes   SDeatra James MD [facility] Pager [phone] If 7PM-7AM, please contact night-coverage [email or web address] Password [email or web address] 12/04/2017, 11:22 AM

## 2017-12-04 NOTE — Progress Notes (Signed)
Pharmacy Antibiotic Note  Monica Poole is a 42 y.o. female with breast cancer undergoing chemotherapy admitted on 12/03/2017 with fevers/HCAP/sepsis.  Pharmacy has been consulted for Vancomycin and Zosyn  Dosing.  Vancomycin 1 g IV given in ED at  [place]: Vancomycin 750 mg IV q12h Zosyn 3.375 g IV q8h   Height: 5\' 2"  (157.5 cm) Weight: 172 lb (78 kg) IBW/kg (Calculated) : 50.1  Temp (24hrs), Avg:100.9 F (38.3 C), Min:98.8 F (37.1 C), Max:102.2 F (39 C)  Recent Labs  Lab 12/03/17 1843 12/03/17 1916 12/04/17 0205  WBC 14.1*  --   --   CREATININE 1.04*  --   --   LATICACIDVEN  --  1.82 2.58*    Estimated Creatinine Clearance: 70.3 mL/min (A) (by C-G formula based on SCr of 1.04 mg/dL (H)).    Allergies  Allergen Reactions  . 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 Dicyclomine Other (See Comments)    Other reaction(s): Other (See Comments) CARDIAC ARREST CARDIAC ARREST   . Latex     Other reaction(s): Other (See Comments) BLISTERS BLISTERS   . Bentyl [Dicyclomine Hcl] Other (See Comments)    Cardiac arrest    [name] 12/04/2017 5:45 AM
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
## 2017-12-04 NOTE — ED Notes (Signed)
Regular Diet Ordered for Lunch.

## 2017-12-04 NOTE — ED Notes (Addendum)
Patient placed on a monitor and pulse oximetry , NS IV bolus infusing , Zosyn /Vancomycin IV infusing , IV sites intact , 1st set of blood culture specimen collected . Unable to give urine specimen at this time .

## 2017-12-04 NOTE — Progress Notes (Signed)
Admission note:  Arrival Method: Patient arrived in bed accompanied by the staff from ED. Mental Orientation: Alert and oriented x 4. Telemetry: 42M-14, NSR, CCMD notified. Assessment: See doc flow sheets. Skin: Warm dry and intact. IV: Right hand Zosyn infusing, L Hand and L AC saline lock. Pain: 2/10.   Safety Measures: Bed in low position, call bell and phone within reach. Fall Prevention Safety Plan: Reviewed the plan, verbalized understanding. Admission Screening: In process. 6700 Orientation: Patient has been oriented to the unit, staff and to the room.

## 2017-12-05 DIAGNOSIS — J189 Pneumonia, unspecified organism: Secondary | ICD-10-CM | POA: Diagnosis not present

## 2017-12-05 DIAGNOSIS — A419 Sepsis, unspecified organism: Secondary | ICD-10-CM

## 2017-12-05 DIAGNOSIS — R509 Fever, unspecified: Secondary | ICD-10-CM | POA: Diagnosis not present

## 2017-12-05 DIAGNOSIS — J45909 Unspecified asthma, uncomplicated: Secondary | ICD-10-CM | POA: Diagnosis not present

## 2017-12-05 DIAGNOSIS — C50919 Malignant neoplasm of unspecified site of unspecified female breast: Secondary | ICD-10-CM | POA: Diagnosis not present

## 2017-12-05 LAB — CMV IGM: CMV IgM: <30 AU/mL (ref 0.0–29.9)

## 2017-12-05 LAB — URINE CULTURE: Culture: <10000 — AB

## 2017-12-05 LAB — EPSTEIN-BARR VIRUS VCA, IGM: EBV VCA IgM: <36 U/mL (ref 0.0–35.9)

## 2017-12-05 LAB — STREP PNEUMONIAE URINARY ANTIGEN: Strep Pneumo Urinary Antigen: NEGATIVE

## 2017-12-05 LAB — EPSTEIN-BARR VIRUS VCA, IGG: EBV VCA IgG: 65.8 U/mL — ABNORMAL HIGH (ref 0.0–17.9)

## 2017-12-05 MED ORDER — AMOXICILLIN-POT CLAVULANATE 875-125 MG PO TABS: 1.0000 | ORAL_TABLET | Freq: Two times a day (BID) | ORAL | 0 refills | Status: AC

## 2017-12-05 NOTE — Care Management Note (Addendum)
Case Management Note  Patient Details  Name: Monica Poole MRN: [id number] Date of Birth: 10-14-1976  Subjective/Objective: Admitted for sepsis secondary to pneumonia        Action/Plan: Prior to admission patient lived at home with spouse.  PCP noted.Patient with discharge orders today. No discharge needs noted.  Expected Discharge Date:  12/05/17               Expected Discharge Plan:  Home/Self Care  In-House Referral:  NA  Discharge planning Services  CM Consult   Status of Service:  Completed, signed off  Kristen Cardinal, RN 12/05/2017, 1:58 PM

## 2017-12-05 NOTE — Progress Notes (Signed)
Patient discharged to home, AVS reviewed, IV removed, telebox returned, patient left floor via wheelchair with staff member

## 2017-12-05 NOTE — Discharge Instructions (Signed)

## 2017-12-05 NOTE — Progress Notes (Signed)
Patient did not want to be stuck by lab this morning- stating that she was told that her labs could be drawn from her IV's. There are no care orders to do so. NP paged.

## 2017-12-05 NOTE — Discharge Summary (Signed)
Physician Discharge Summary  Monica Poole JJH:[id number] DOB: 1975-11-06 DOA: 12/03/2017  PCP: [name], MD  Admit date: 12/03/2017 Discharge date: 12/05/2017  Time spent: 45 minutes  Recommendations for Outpatient Follow-up:  Patient will be discharged to home.  Patient will need to follow up with primary care provider within one week of discharge.  Patient should continue medications as prescribed.  Patient should follow a regular diet.   Discharge Diagnoses:  Sepsis secondary to pneumonia History of asthma Breast cancer Acute kidney injury  Discharge Condition: Stable  Diet recommendation: Regular  Filed Weights   12/03/17 1804 12/03/17 1821 12/04/17 0114  Weight: 78 kg (172 lb) 78 kg (172 lb) 78 kg (172 lb)    History of present illness:  On 12/04/2017 by Dr. Luetta Nutting is a 42 y.o. female with history of breast cancer and asthma last chemotherapy was last week and goes to cancer center with [name] presents to the ER because of fever chills with right-sided pleuritic type chest pain for the last 3 days.  Patient has been in cough which is nonproductive.  Patient states her son has been sick for the last few days and flu panel and strep throat were negative for her son.  Patient denies any nausea vomiting diarrhea abdominal pain.  Hospital Course:  Sepsis secondary to pneumonia -Upon admission, patient was febrile with leukocytosis and elevated lactic acid level -Presented with right-sided pleuritic chest pain around the breast area and increasing with movement -CTA showed no evidence of pulmonary embolism.  New consolidation central right middle lung lobe. -Was started on vancomycin and zosyn -Patient states she is feeling better, feels no wheezing and denies cough -Will discharge patient with Augmentin -CMV IgM negative -RSV and EBV pending  -Strep pneumonia urine antigen and legionella urine antigen pending and can be followed by  PCP  History of asthma -Currently no wheezing, appears to be stable -Continue home medications  Breast cancer -CTA showed 2.1 cm nodule medial aspect of the left breast may have increased mildly since July 2018 -Patient follows with [facility] and has an appointment on 12/17/2017 -Continue prednisone -Patient was given 1 dose of hydrocortisone as a stress dose.  Acute kidney injury -Resolved -Suspect secondary to sepsis and poor oral intake -Creatinine was 1.04 on admission, currently down to 0.7  Code status: DNR  Procedures: None  Consultations: None  Discharge Exam: Vitals:   12/05/17 0845 12/05/17 0953  BP: 136/86   Pulse: 92   Resp: 16   Temp: 97.9 F (36.6 C)   SpO2: 99% 98%   She is feeling better.  Feels breathing has improved.  Denies current cough.  Denies chest pain, abdominal pain, nausea or vomiting, diarrhea or constipation, dizziness or headache.   General: Well developed, well nourished, NAD, appears stated age  HEENT: NCAT, mucous membranes moist.  Cardiovascular: S1 S2 auscultated, no rubs, murmurs or gallops. Regular rate and rhythm.  Respiratory: Clear to auscultation bilaterally with equal chest rise  Abdomen: Soft, nontender, nondistended, + bowel sounds  Extremities: warm dry without cyanosis clubbing or edema  Neuro: AAOx3, nonfocal  Psych: Normal affect and demeanor with intact judgement and insight  Discharge Instructions Discharge Instructions    Discharge instructions   Complete by:  As directed    Patient will be discharged to home.  Patient will need to follow up with primary care provider within one week of discharge.  Patient should continue medications as prescribed.  Patient should follow a regular diet.     Allergies as of 12/05/2017      Reactions   Bee Venom Anaphylaxis   HAS EPI PEN ALSO WASP HAS EPI PEN ALSO WASP   Dicyclomine Other (See Comments)   Other reaction(s): Other (See Comments) CARDIAC  ARREST CARDIAC ARREST   Latex    Other reaction(s): Other (See Comments) BLISTERS BLISTERS   Bentyl [dicyclomine Hcl] Other (See Comments)   Cardiac arrest      Medication List    TAKE these medications   acetaminophen 500 MG tablet Commonly known as:  TYLENOL Take 1,000 mg by mouth every 6 (six) hours as needed for fever.   albuterol 1.25 MG/3ML nebulizer solution Commonly known as:  ACCUNEB Take 1 ampule by nebulization every 4 (four) hours as needed for wheezing.   amoxicillin-clavulanate 875-125 MG tablet Commonly known as:  AUGMENTIN Take 1 tablet by mouth 2 (two) times daily for 7 days.   budesonide 0.5 MG/2ML nebulizer solution Commonly known as:  PULMICORT Take 0.5 mg by nebulization 2 (two) times daily as needed (wheezing).   budesonide-formoterol 160-4.5 MCG/ACT inhaler Commonly known as:  SYMBICORT Inhale 2 puffs into the lungs 2 (two) times daily.   cetirizine 10 MG tablet Commonly known as:  ZYRTEC Take 10 mg by mouth daily as needed for allergies.   fluocinonide ointment 0.05 % Commonly known as:  LIDEX Apply 1 application topically 2 (two) times daily as needed (cold sores).   Ipratropium-Albuterol 20-100 MCG/ACT Aers respimat Commonly known as:  COMBIVENT Inhale 1 puff into the lungs every 6 (six) hours as needed for wheezing or shortness of breath. What changed:  Another medication with the same name was changed. Make sure you understand how and when to take each.   ipratropium-albuterol 0.5-2.5 (3) MG/3ML Soln Commonly known as:  DUONEB Take 3 mLs by nebulization every 6 (six) hours as needed. What changed:  reasons to take this   lidocaine 2 % jelly Commonly known as:  XYLOCAINE Apply 1 application topically daily as needed (pain).   ondansetron 8 MG tablet Commonly known as:  ZOFRAN Take 8 mg by mouth every 8 (eight) hours as needed for nausea or vomiting.   oxycodone 30 MG immediate release tablet Commonly known as:  ROXICODONE Take 30  mg by mouth See admin instructions. Twice daily every three weeks   oxyCODONE-acetaminophen 10-325 MG tablet Commonly known as:  PERCOCET TK 1 T PO  Q 8-12 H PRN FOR 30 DAYS   predniSONE 20 MG tablet Commonly known as:  DELTASONE Take 2 days of 40 mg (2 tabs) until 12/22.  Then take 5 days of 20 mg (1 tab) until 12/27.  Then take 5 days of 10 mg (0.5 tab) until 01/01. What changed:    how much to take  how to take this  when to take this  additional instructions   prochlorperazine 10 MG tablet Commonly known as:  COMPAZINE Take 10 mg by mouth every 8 (eight) hours as needed for nausea or vomiting.      Allergies  Allergen Reactions  . 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 Dicyclomine Other (See Comments)    Other reaction(s): Other (See Comments) CARDIAC ARREST CARDIAC ARREST   . Latex     Other reaction(s): Other (See Comments) BLISTERS BLISTERS   . Bentyl [Dicyclomine Hcl] Other (See Comments)    Cardiac arrest  Follow-up Information    [name], MD. Schedule an appointment as soon as possible for a visit in 1 week(s).   Specialty:  Internal Medicine Why:  Hospital follow up Contact information: [address] [place] [address] [phone]            The results of significant diagnostics from this hospitalization (including imaging, microbiology, ancillary and laboratory) are listed below for reference.    Significant Diagnostic Studies: Dg Chest 2 View  Result Date: 12/03/2017 CLINICAL DATA:  Nausea and vomiting. EXAM: CHEST  2 VIEW COMPARISON:  Chest CT and chest radiograph October 17, 2017 FINDINGS: There is no demonstrable nasogastric tube. There is atelectatic change in the lung bases. Lungs elsewhere clear. Heart size and pulmonary vascularity are normal. No adenopathy. No bone lesions. IMPRESSION: Bibasilar atelectasis. No frank edema or consolidation. Heart size normal. Note that there  is no demonstrable nasogastric tube. Electronically Signed   By: Lowella Grip III M.D.   On: 12/03/2017 19:17   Ct Angio Chest Pe W Or Wo Contrast  Result Date: 12/04/2017 CLINICAL DATA:  Acute onset of right-sided chest pain and fever. EXAM: CT ANGIOGRAPHY CHEST WITH CONTRAST TECHNIQUE: Multidetector CT imaging of the chest was performed using the standard protocol during bolus administration of intravenous contrast. Multiplanar CT image reconstructions and MIPs were obtained to evaluate the vascular anatomy. CONTRAST:  168mL ISOVUE-370 IOPAMIDOL (ISOVUE-370) INJECTION 76% COMPARISON:  CTA of the chest performed 10/17/2017, and chest radiograph performed 12/03/2017 FINDINGS: Cardiovascular:  There is no evidence of pulmonary embolus. The heart is normal in size. The thoracic aorta is grossly unremarkable. The great vessels are within normal limits. Mediastinum/Nodes: The mediastinum is unremarkable in appearance. No mediastinal lymphadenopathy is seen. No pericardial effusion is identified. The thyroid gland is unremarkable. No axillary lymphadenopathy is appreciated. Lungs/Pleura: There is new consolidation at the central right middle lobe, concerning for pneumonia. Underlying bibasilar atelectasis is noted. No pleural effusion or pneumothorax is seen. No dominant mass is identified. Upper Abdomen: The visualized portions of the liver and spleen are unremarkable. The visualized portions of the adrenal glands and kidneys are within normal limits. Musculoskeletal: No acute osseous abnormalities are identified. The visualized musculature is unremarkable in appearance. A 2.1 cm nodule is suggested at the medial aspect of the left breast. This may have increased mildly in size from July 2018. Review of the MIP images confirms the above findings. IMPRESSION: 1. No evidence of pulmonary embolus. 2. New consolidation at the central right middle lung lobe, concerning for pneumonia. Underlying bibasilar atelectasis  noted. 3. 2.1 cm nodule at the medial aspect of the left breast. This may have increased mildly in size from July 2018. Would correlate with prior mammogram, and consider further evaluation as deemed clinically appropriate. Electronically Signed   By: Garald Balding M.D.   On: 12/04/2017 05:24    Microbiology: Recent Results (from the past 240 hour(s))  Urine culture     Status: Abnormal   Collection Time: 12/04/17  3:44 AM  Result Value Ref Range Status   Specimen Description URINE, CLEAN CATCH  Final   Special Requests NONE  Final   Culture (A)  Final    <10,000 COLONIES/mL INSIGNIFICANT GROWTH Performed at [facility], [address], [place],  [address]    Report Status 12/05/2017 FINAL  Final     Labs: Basic Metabolic Panel: Recent Labs  Lab 12/03/17 1843 12/04/17 0539 12/04/17 1037  NA 136 138 139  K  3.9 3.3* 4.0  CL 101 108 111  CO2 24 19* 17*  GLUCOSE 116* 124* 161*  BUN 17 7 8   CREATININE 1.04* 0.82 0.79  CALCIUM 9.3 7.9* 8.8*   Liver Function Tests: Recent Labs  Lab 12/03/17 1843 12/04/17 0539  AST 25 20  ALT 29 24  ALKPHOS 49 43  BILITOT 0.6 0.8  PROT 6.3* 5.2*  ALBUMIN 3.7 2.8*   No results for input(s): LIPASE, AMYLASE in the last 168 hours. No results for input(s): AMMONIA in the last 168 hours. CBC: Recent Labs  Lab 12/03/17 1843 12/04/17 0539 12/04/17 1037  WBC 14.1* 12.6* 13.1*  NEUTROABS 12.4* 11.0*  --   HGB 12.2 11.1* 12.0  HCT 38.1 34.7* 37.7  MCV 99.0 99.1 98.7  PLT 241 201 234   Cardiac Enzymes: No results for input(s): CKTOTAL, CKMB, CKMBINDEX, TROPONINI in the last 168 hours. BNP: BNP (last 3 results) No results for input(s): BNP in the last 8760 hours.  ProBNP (last 3 results) No results for input(s): PROBNP in the last 8760 hours.  CBG: Recent Labs  Lab 12/04/17 2244  GLUCAP 131*       Signed:  Jayliani Wanner  [facility] 12/05/2017, 1:55 PM

## 2017-12-06 LAB — RSV(RESPIRATORY SYNCYTIAL VIRUS) AB, BLOOD: RSV Ab: 1:8 {titer} — ABNORMAL HIGH

## 2017-12-06 LAB — LEGIONELLA PNEUMOPHILA SEROGP 1 UR AG: L. pneumophila Serogp 1 Ur Ag: NEGATIVE

## 2017-12-09 LAB — CULTURE, BLOOD (ROUTINE X 2)
Culture: NO GROWTH
Culture: NO GROWTH
Special Requests: ADEQUATE

## 2017-12-11 ENCOUNTER — Ambulatory Visit (INDEPENDENT_AMBULATORY_CARE_PROVIDER_SITE_OTHER): Payer: BLUE CROSS/BLUE SHIELD | Admitting: Allergy

## 2017-12-11 ENCOUNTER — Encounter: Payer: Self-pay | Admitting: Allergy

## 2017-12-11 VITALS — BP 130/70 | HR 64 | Temp 98.3°F | Resp 12

## 2017-12-11 DIAGNOSIS — Z91038 Other insect allergy status: Secondary | ICD-10-CM

## 2017-12-11 DIAGNOSIS — J4540 Moderate persistent asthma, uncomplicated: Secondary | ICD-10-CM | POA: Diagnosis not present

## 2017-12-11 DIAGNOSIS — Z91030 Bee allergy status: Secondary | ICD-10-CM

## 2017-12-11 DIAGNOSIS — J309 Allergic rhinitis, unspecified: Secondary | ICD-10-CM

## 2017-12-11 DIAGNOSIS — Z8701 Personal history of pneumonia (recurrent): Secondary | ICD-10-CM | POA: Diagnosis not present

## 2017-12-11 DIAGNOSIS — H1010 Acute atopic conjunctivitis, unspecified eye: Secondary | ICD-10-CM

## 2017-12-11 MED ORDER — MOMETASONE FURO-FORMOTEROL FUM 200-5 MCG/ACT IN AERO: INHALATION_SPRAY | RESPIRATORY_TRACT | 5 refills | Status: AC

## 2017-12-11 NOTE — Progress Notes (Signed)
Follow-up Note  RE: Monica Poole MRN: [id number] DOB: [birth year] Date of Office Visit: 12/11/2017   History of present illness: Monica Poole is a 42 y.o. female presenting today for follow-up of recent pneumonia.  She was last seen in the office on November 08, 2017 for initial visit.  Since this visit she had another chemo cycle and developed a pneumonia following chemo.  She states this has happened on multiple occasions where she has had chemo and then developed a bacterial illness.  She states her symptoms this time are much different than her previous pneumonias and that she had right chest pain instead of lower back pain.  She also had a fever up to 103.5.  She was hospitalized from February 4 - December 05, 2016.  Per the hospital discharge summary on admission she was febrile with leukocytosis and elevated lactic acid.  She had a CTA showing no evidence of pulmonary embolism however there was new consolidation in the central right middle lobe.  She was initially started on vancomycin and Zosyn and was transitioned to Augmentin which she was discharged on and has several days left to complete.  She also was provided with a prednisone taper.   She did have viral studies done in the hospital that was positive to RSV.  While in the hospital she was treated with [facility] per formulary and she states that she felt [facility] was more effective than Symbicort.  She was not discharged with the inhaler but she would like to change over to [facility] if possible. She is set to have her last round of chemo on next Monday.   She does continue to take cetirizine twice a day as well as has access to nasal steroid spray and antihistamine-based eyedrop for as needed use.  She also has an EpiPen for stinging insect allergy.  Review of systems: Review of Systems  Constitutional: Positive for fever and malaise/fatigue.  HENT: Negative for congestion, ear discharge, ear pain, nosebleeds, sinus pain and sore throat.     Eyes: Negative for pain, discharge and redness.  Respiratory: Positive for cough and shortness of breath.   Cardiovascular: Positive for chest pain.  Gastrointestinal: Positive for nausea and vomiting. Negative for abdominal pain, constipation and diarrhea.  Musculoskeletal: Negative for joint pain.  Skin: Negative for itching and rash.  Neurological: Negative for headaches.    All other systems negative unless noted above in HPI  Past medical/social/surgical/family history have been reviewed and are unchanged unless specifically indicated below.  No changes  Medication List: Allergies as of 12/11/2017      Reactions   Bee Venom Anaphylaxis   HAS EPI PEN ALSO WASP HAS EPI PEN ALSO WASP   Dicyclomine Other (See Comments)   Other reaction(s): Other (See Comments) CARDIAC ARREST CARDIAC ARREST   Latex    Other reaction(s): Other (See Comments) BLISTERS BLISTERS   Bentyl [dicyclomine Hcl] Other (See Comments)   Cardiac arrest      Medication List        Accurate as of 12/11/17  4:58 PM. Always use your most recent med list.          acetaminophen 500 MG tablet Commonly known as:  TYLENOL Take 1,000 mg by mouth every 6 (six) hours as needed for fever.   albuterol 1.25 MG/3ML nebulizer solution Commonly known as:  ACCUNEB Take 1 ampule by nebulization every 4 (four) hours as needed for wheezing.   amoxicillin-clavulanate 875-125 MG tablet Commonly known as:  AUGMENTIN Take 1 tablet by mouth 2 (two) times daily for 7 days.   budesonide 0.5 MG/2ML nebulizer solution Commonly known as:  PULMICORT Take 0.5 mg by nebulization 2 (two) times daily as needed (wheezing).   budesonide-formoterol 160-4.5 MCG/ACT inhaler Commonly known as:  SYMBICORT Inhale 2 puffs into the lungs 2 (two) times daily.   cetirizine 10 MG tablet Commonly known as:  ZYRTEC Take 10 mg by mouth daily as needed for allergies.   fluocinonide ointment 0.05 % Commonly known as:  LIDEX    gabapentin 300 MG capsule Commonly known as:  NEURONTIN   Ipratropium-Albuterol 20-100 MCG/ACT Aers respimat Commonly known as:  COMBIVENT Inhale 1 puff into the lungs every 6 (six) hours as needed for wheezing or shortness of breath.   ipratropium-albuterol 0.5-2.5 (3) MG/3ML Soln Commonly known as:  DUONEB Take 3 mLs by nebulization every 6 (six) hours as needed.   lidocaine 2 % jelly Commonly known as:  XYLOCAINE   meloxicam 15 MG tablet Commonly known as:  MOBIC   mometasone-formoterol 200-5 MCG/ACT Aero Commonly known as:  DULERA Inhale two puffs twice daily to prevent cough or wheeze.  Rinse, gargle, and spit after use.   naproxen 250 MG tablet Commonly known as:  NAPROSYN   ondansetron 8 MG tablet Commonly known as:  ZOFRAN   oxycodone 30 MG immediate release tablet Commonly known as:  ROXICODONE   oxyCODONE-acetaminophen 10-325 MG tablet Commonly known as:  PERCOCET   predniSONE 20 MG tablet Commonly known as:  DELTASONE Take 2 days of 40 mg (2 tabs) until 12/22.  Then take 5 days of 20 mg (1 tab) until 12/27.  Then take 5 days of 10 mg (0.5 tab) until 01/01.   prochlorperazine 10 MG tablet Commonly known as:  COMPAZINE       Known medication allergies: Allergies  Allergen Reactions  . 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 Dicyclomine Other (See Comments)    Other reaction(s): Other (See Comments) CARDIAC ARREST CARDIAC ARREST   . Latex     Other reaction(s): Other (See Comments) BLISTERS BLISTERS   . Bentyl [Dicyclomine Hcl] Other (See Comments)    Cardiac arrest     Physical examination: Blood pressure 130/70, pulse 64, temperature 98.3 F (36.8 C), temperature source Oral, resp. rate 12.  General: Alert, interactive, in no acute distress. HEENT: PERRLA, TMs pearly gray, turbinates non-edematous without discharge, post-pharynx non erythematous. Neck: Supple without lymphadenopathy. Lungs: Clear to  auscultation without wheezing, rhonchi or rales. {no increased work of breathing. CV: Normal S1, S2 without murmurs. Abdomen: Nondistended, nontender. Skin: Warm and dry, without lesions or rashes. Extremities:  No clubbing, cyanosis or edema. Neuro:   Grossly intact.  Diagnositics/Labs: Labs:  Component     Latest Ref Rng & Units 11/08/2017  WBC     3.4 - 10.8 x10E3/uL 5.0  RBC     3.77 - 5.28 x10E6/uL 4.14  Hemoglobin     11.1 - 15.9 g/dL 13.3  HCT     34.0 - 46.6 % 40.6  MCV     79 - 97 fL 98 (H)  MCH     26.6 - 33.0 pg 32.1  MCHC     31.5 - 35.7 g/dL 32.8  RDW     12.3 - 15.4 % 16.6 (H)  Platelets     150 - 379 x10E3/uL 213  Neutrophils     Not Estab. %  72  Lymphs     Not Estab. % 22  Monocytes     Not Estab. % 2  Eos     Not Estab. % 4  Basos     Not Estab. % 0  NEUT#     1.4 - 7.0 x10E3/uL 3.6  Lymphocyte #     0.7 - 3.1 x10E3/uL 1.1  Monocytes Absolute     0.1 - 0.9 x10E3/uL 0.1  EOS (ABSOLUTE)     0.0 - 0.4 x10E3/uL 0.2  Basophils Absolute     0.0 - 0.2 x10E3/uL 0.0  Immature Granulocytes     Not Estab. % 0  Immature Grans (Abs)     0.0 - 0.1 x10E3/uL 0.0  Class Description      Comment  Honeybee IgE     Class 0 kU/L <0.10  Hornet, White Face, IgE     Class 0 kU/L <0.10  Yellow Jacket, IgE     Class 0 kU/L <0.10  Paper Wasp IgE     Class 0 kU/L <0.10  Hornet, Yellow, IgE     Class 0 kU/L <0.10  IgE (Immunoglobulin E), Serum     0 - 100 IU/mL 24   From recent admission: Component     Latest Ref Rng & Units 12/04/2017 12/05/2017  Sodium     135 - 145 mmol/L 139   Potassium     3.5 - 5.1 mmol/L 4.0   Chloride     101 - 111 mmol/L 111   CO2     22 - 32 mmol/L 17 (L)   Glucose     65 - 99 mg/dL 161 (H)   BUN     6 - 20 mg/dL 8   Creatinine     0.44 - 1.00 mg/dL 0.79   Calcium     8.9 - 10.3 mg/dL 8.8 (L)   GFR, Est Non African American     >60 mL/min >60   GFR, Est African American     >60 mL/min >60   Anion gap     5 - 15 11     WBC     4.0 - 10.5 K/uL 13.1 (H)   RBC     3.87 - 5.11 MIL/uL 3.82 (L)   Hemoglobin     12.0 - 15.0 g/dL 12.0   HCT     36.0 - 46.0 % 37.7   MCV     78.0 - 100.0 fL 98.7   MCH     26.0 - 34.0 pg 31.4   MCHC     30.0 - 36.0 g/dL 31.8   RDW     11.5 - 15.5 % 13.6   Platelets     150 - 400 K/uL 234   L. pneumophila Serogp 1 Ur Ag     Negative  Negative  Source of Sample       URINE, RANDOM  EBV VCA IgG     0.0 - 17.9 U/mL 65.8 (H)   EBV VCA IgM     0.0 - 35.9 U/mL <36.0   RSV Ab     Neg:<1:8 1:8 (H)   CMV IgM     0.0 - 29.9 AU/mL <30.0   Strep Pneumo Urinary Antigen     NEGATIVE  NEGATIVE     CXR 12/03/17 Bibasilar atelectasis. No frank edema or consolidation. Heart size normal. Note that there is no demonstrable nasogastric tube.  CTA 12/04/17 1. No evidence of pulmonary embolus. 2. New  consolidation at the central right middle lung lobe, concerning for pneumonia. Underlying bibasilar atelectasis noted. 3. 2.1 cm nodule at the medial aspect of the left breast. This may have increased mildly in size from July 2018. Would correlate with prior mammogram, and consider further evaluation as deemed clinically appropriate.  Spirometry: FEV1: 2.01L  68%, FVC: 2.74L  77%, ratio consistent with Lung function is slightly reduced today status post pneumonia   Assessment and plan:   Recent Pneumonia  - labs/viral studies done from hospitalization did show positive to RSV which led to pneumonia  - finish out Augmentin course  - lung exam today is normal without any focal changes.   - ok from my standpoint to undergo chemo cycle on 12/17/17 as long as she remains afebrile and respiratory symptoms are controlled  Asthma, mod persistent    - have access to combivent inhaler 2-3 puffs every 6 hours or nebulizer 1 vial every 4-6 hours as needed for cough/wheeze/shortness of breath/chest tightness.  May use 15-20 minutes prior to activity.   Monitor frequency of use.      - will  change Symbicort to [facility] 200 take 2 puffs twice a day (sample provided today)    - you do have evidence of eosinophilia base on labwork and would be eligible for asthma biologic agents like Nucala, Fasenra or Dupixent in the future if needed to step-up asthma management.     Asthma control goals:   Full participation in all desired activities (may need albuterol before activity)  Albuterol use two time or less a week on average (not counting use with activity)  Cough interfering with sleep two time or less a month  Oral steroids no more than once a year  No hospitalizations  Allergic rhinoconjunctivitis - continue avoidance measures for grasses, ragweed, trees, molds, dust mites, cat, dog and cockroach.   - continue Cetirizine twice a day - for nasal congestion/drainage may use OTC Flonase, Nasocort or Rhinocort 1-2 sprays each nostril daily - for itchy/watery/red eyes may use Pazeo or Pataday 1 drop each eye as needed daily  - allergen immunotherapy (allergy shots) benefits/risks and protocol discussed previously.  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  We will place you on our list for skin testing to venoms which is done quarterly.  Our office with notify you of the date it will be performed.   - have access to Epipen in case of sting and allergic reaction - you would benefit from venom immunotherapy due to severe previous reaction to stinging insect.    Follow-up 4 months or sooner if needed   I appreciate the opportunity to take part in Monica Poole's care. Please do not hesitate to contact me with questions.  Sincerely,   Prudy Feeler, MD Allergy/Immunology [facility]

## 2017-12-11 NOTE — Patient Instructions (Addendum)
Recent Pneumonia  - labs/swabs done from hospitalization did show positive to RSV which led to pneumonia  - finish out Augmentin course  - lung exam today is normal without any focal changes and is clear.   - ok from my standpoint to undergo chemo cycle on 12/17/17.    Asthma    - have access to combivent inhaler 2-3 puffs every 6 hours or nebulizer 1 vial every 4-6 hours as needed for cough/wheeze/shortness of breath/chest tightness.  May use 15-20 minutes prior to activity.   Monitor frequency of use.      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     Asthma control goals:   Full participation in all desired activities (may need albuterol before activity)  Albuterol use two time or less a week on average (not counting use with activity)  Cough interfering with sleep two time or less a month  Oral steroids no more than once a year  No hospitalizations   Allergic rhinoconjunctivitis - continue avoidance measures for grasses, ragweed, trees, molds, dust mites, cat, dog and cockroach.   - continue Cetirizine twice a day - for nasal congestion/drainage may use OTC Flonase, Nasocort or Rhinocort 1-2 sprays each nostril daily - for itchy/watery/red eyes may use Pazeo or Pataday 1 drop each eye as needed daily  - allergen immunotherapy (allergy shots) benefits/risks and protocol discussed previously.  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  We will place you on our list for skin testing to venoms which is done quarterly.  Our office with notify you of the date it will be performed.   - have access to Epipen in case of sting and allergic reaction - you  would benefit from venom immunotherapy due to severe previous reaction to stinging insect.    Follow-up 4 months or sooner if needed

## 2018-03-07 ENCOUNTER — Ambulatory Visit: Payer: BLUE CROSS/BLUE SHIELD | Admitting: Allergy and Immunology

## 2018-03-11 ENCOUNTER — Ambulatory Visit: Payer: BLUE CROSS/BLUE SHIELD | Admitting: Allergy and Immunology

## 2018-06-30 HISTORY — PX: OOPHORECTOMY: SHX86

## 2018-07-29 ENCOUNTER — Emergency Department (HOSPITAL_COMMUNITY)
Admission: EM | Admit: 2018-07-29 | Discharge: 2018-07-29 | Disposition: A | Discharge status: Home / Self Care | Payer: BLUE CROSS/BLUE SHIELD | Attending: Emergency Medicine | Admitting: Emergency Medicine

## 2018-07-29 ENCOUNTER — Emergency Department (HOSPITAL_COMMUNITY): Payer: BLUE CROSS/BLUE SHIELD

## 2018-07-29 ENCOUNTER — Encounter (HOSPITAL_COMMUNITY): Payer: Self-pay | Admitting: Emergency Medicine

## 2018-07-29 ENCOUNTER — Other Ambulatory Visit: Payer: Self-pay

## 2018-07-29 DIAGNOSIS — Z91040 Latex allergy status: Secondary | ICD-10-CM | POA: Diagnosis not present

## 2018-07-29 DIAGNOSIS — R5383 Other fatigue: Secondary | ICD-10-CM | POA: Diagnosis not present

## 2018-07-29 DIAGNOSIS — J45909 Unspecified asthma, uncomplicated: Secondary | ICD-10-CM | POA: Diagnosis not present

## 2018-07-29 DIAGNOSIS — R102 Pelvic and perineal pain: Secondary | ICD-10-CM | POA: Diagnosis not present

## 2018-07-29 DIAGNOSIS — G8918 Other acute postprocedural pain: Secondary | ICD-10-CM | POA: Insufficient documentation

## 2018-07-29 DIAGNOSIS — R531 Weakness: Secondary | ICD-10-CM | POA: Diagnosis present

## 2018-07-29 DIAGNOSIS — Z79899 Other long term (current) drug therapy: Secondary | ICD-10-CM | POA: Diagnosis not present

## 2018-07-29 LAB — URINALYSIS, ROUTINE W REFLEX MICROSCOPIC
Bilirubin Urine: NEGATIVE
Glucose, UA: NEGATIVE mg/dL
Hgb urine dipstick: NEGATIVE
Ketones, ur: 5 mg/dL — AB
Nitrite: NEGATIVE
Protein, ur: NEGATIVE mg/dL
Specific Gravity, Urine: >1.046 — ABNORMAL HIGH (ref 1.005–1.030)
pH: 6 (ref 5.0–8.0)

## 2018-07-29 LAB — COMPREHENSIVE METABOLIC PANEL
ALT: 22 U/L (ref 0–44)
AST: 28 U/L (ref 15–41)
Albumin: 4 g/dL (ref 3.5–5.0)
Alkaline Phosphatase: 119 U/L (ref 38–126)
Anion gap: 11 (ref 5–15)
BUN: 11 mg/dL (ref 6–20)
CO2: 23 mmol/L (ref 22–32)
Calcium: 8.8 mg/dL — ABNORMAL LOW (ref 8.9–10.3)
Chloride: 103 mmol/L (ref 98–111)
Creatinine, Ser: 0.84 mg/dL (ref 0.44–1.00)
GFR calc Af Amer: >60 mL/min (ref >60)
GFR calc non Af Amer: >60 mL/min (ref >60)
Glucose, Bld: 110 mg/dL — ABNORMAL HIGH (ref 70–99)
Potassium: 3.4 mmol/L — ABNORMAL LOW (ref 3.5–5.1)
Sodium: 137 mmol/L (ref 135–145)
Total Bilirubin: 1 mg/dL (ref 0.3–1.2)
Total Protein: 6.5 g/dL (ref 6.5–8.1)

## 2018-07-29 LAB — CBC WITH DIFFERENTIAL/PLATELET
Abs Immature Granulocytes: 0 10*3/uL (ref 0.0–0.1)
Basophils Absolute: 0.1 10*3/uL (ref 0.0–0.1)
Basophils Relative: 1 %
Eosinophils Absolute: 0.1 10*3/uL (ref 0.0–0.7)
Eosinophils Relative: 1 %
HCT: 37.5 % (ref 36.0–46.0)
Hemoglobin: 11.8 g/dL — ABNORMAL LOW (ref 12.0–15.0)
Immature Granulocytes: 0 %
Lymphocytes Relative: 8 %
Lymphs Abs: 0.7 10*3/uL (ref 0.7–4.0)
MCH: 27.4 pg (ref 26.0–34.0)
MCHC: 31.5 g/dL (ref 30.0–36.0)
MCV: 87 fL (ref 78.0–100.0)
Monocytes Absolute: 0.6 10*3/uL (ref 0.1–1.0)
Monocytes Relative: 7 %
Neutro Abs: 7.1 10*3/uL (ref 1.7–7.7)
Neutrophils Relative %: 83 %
Platelets: 207 10*3/uL (ref 150–400)
RBC: 4.31 MIL/uL (ref 3.87–5.11)
RDW: 20.4 % — ABNORMAL HIGH (ref 11.5–15.5)
WBC: 8.6 10*3/uL (ref 4.0–10.5)

## 2018-07-29 LAB — I-STAT TROPONIN, ED: Troponin i, poc: 0 ng/mL (ref 0.00–0.08)

## 2018-07-29 LAB — LIPASE, BLOOD: Lipase: 25 U/L (ref 11–51)

## 2018-07-29 MED ORDER — MORPHINE SULFATE (PF) 2 MG/ML IV SOLN
2.0000 mg | Freq: Once | INTRAVENOUS | Status: AC
  Administered 2018-07-29: 2 mg via INTRAVENOUS
  Filled 2018-07-29: qty 1

## 2018-07-29 MED ORDER — SODIUM CHLORIDE 0.9 % IV BOLUS
1000.0000 mL | Freq: Once | INTRAVENOUS | Status: AC
  Administered 2018-07-29: 1000 mL via INTRAVENOUS

## 2018-07-29 MED ORDER — IOHEXOL 300 MG/ML  SOLN
100.0000 mL | Freq: Once | INTRAMUSCULAR | Status: AC | PRN
  Administered 2018-07-29: 100 mL via INTRAVENOUS

## 2018-07-29 NOTE — ED Triage Notes (Signed)
Pt to ER for evaluation of fatigue, weakness, no appetite, and weight loss since last week. Incisions are mildly reddened. Patient had tubes and ovaries removed 9/18. She has breast cancer and finished radiation in august.

## 2018-07-29 NOTE — ED Provider Notes (Signed)
Patient placed in Quick Look pathway, seen and evaluated   Chief Complaint: Weakness, fatigue  HPI:   Monica Poole is a 42 y.o. female  with a PMH of breast cancer who just underwent hysterectomy with bilateral oophorectomy on 9/17. Since surgery, she has felt weak, fatigued. She has had decreased appetite as well, reports weight loss of 10 lbs since that time. Denies fever or chills. Noticed redness around her incision sites over the last 3 days. Last lab work was 9/17 where white count was 2.9 per paperwork she has in the room.    ROS: + erythema, - fever - cough, congestion  Physical Exam:   Gen: No distress  Neuro: Awake and Alert  Skin: Warm    Focused Exam: Mild erythema around bilateral lap incision sites on the abdomen. No tenderness.    Initiation of care has begun. The patient has been counseled on the process, plan, and necessity for staying for the completion/evaluation, and the remainder of the medical screening examination    Ward, Ozella Almond, PA-C 07/29/18 1333    Lajean Saver, MD 07/30/18 [phone]

## 2018-07-29 NOTE — ED Notes (Signed)
Pt ambulate to bathroom 

## 2018-07-29 NOTE — Discharge Instructions (Signed)
Please return to the Emergency Department for any new or worsening symptoms or if your symptoms do not improve. Please be sure to follow up with your Primary Care Physician as soon as possible regarding your visit today. If you do not have a Primary Doctor please use the resources below to establish one.  Contact a health care provider if: Your fatigue does not get better. You have a fever. You have unintentional weight loss or gain. You have headaches. You have difficulty: Falling asleep. Sleeping throughout the night. You feel angry, guilty, anxious, or sad. You are unable to have a bowel movement (constipation). You skin is dry. Your legs or another part of your body is swollen. Get help right away if: You feel confused. Your vision is blurry. You feel faint or pass out. You have a severe headache. You have severe abdominal, pelvic, or back pain. You have chest pain, shortness of breath, or an irregular or fast heartbeat. You are unable to urinate or you urinate less than normal. You develop abnormal bleeding, such as bleeding from the rectum, vagina, nose, lungs, or nipples. You vomit blood. You have thoughts about harming yourself or committing suicide. You are worried that you might harm someone else.  Do not take your medicine if  develop an itchy rash, swelling in your mouth or lips, or difficulty breathing.   RESOURCE GUIDE  Chronic Pain Problems: Contact [facility]  [phone] Patients need to be referred by their primary care doctor.  Insufficient Money for Medicine: Contact [employer]:  call "211" or [place] [phone].  No Primary Care Doctor: Call [facility]  [phone] - can help you locate a primary care doctor that  accepts your insurance, provides certain services, etc. Physician Referral Service- [phone]  Agencies that provide inexpensive medical care: [facility]  [facility]  [phone] [facility]  [phone] [facility]  [phone] [facility]  [phone] [address] [facility]  [phone]  [place] Providers: [facility]- [address], [address]  [phone], Mon-Fri 9am-7pm, Sat 9am-1pm [place], [address] [place]  [place], [address] [place]  [place]- [address]  [place], [address], [phone]  Only accepts [place] patients after they have their name  applied to their card  Self Pay (no insurance) in [facility]: Sickle Cell Patients: Dr Kevan Ny, [facility]  [place], [facility]- [place]  [facility] [place]- [address], [facility]- see information above (Speak to [employer] if you do not have insurance)       -  [facility]- [place], [place],  [place] [address], [phone]       -  Dr Vista Lawman-  [address], [place], [facility]- [address] [phone]       -  [place], [place], also [address], [phone]       -    [facility]- [address], [place], 1st & 3rd Saturday   every month, 10am-1pm  1) Find a Doctor and Pay Out of Pocket Although you won't have to find out who is covered by your insurance plan, it is a good idea to ask around and get recommendations. You will then need to call the office and see if the doctor you have chosen will accept you as a new patient and what types of options they offer for patients who are self-pay. Some doctors  offer discounts or will set up payment plans for their patients who do not have insurance, but you will need to ask so you aren't surprised when you get to your appointment.  2) Contact Your Local Health Department Not all health departments have doctors that can see patients for sick visits, but many do, so it is worth a call to see if yours does. If you don't know where your local health department is, you can check in your phone book. The CDC also has a tool to help you locate your state's health department, and many state websites also have listings of all of their local health departments.  3) Find a [facility] If your illness is not likely to be very severe or complicated, you may want to try a walk in clinic. These are popping up all over the country in pharmacies, drugstores, and shopping centers. They're usually staffed by nurse practitioners or physician assistants that have been trained to treat common illnesses and complaints. They're usually fairly quick and inexpensive. However, if you have serious medical issues or chronic medical problems, these are probably not your best option  [facility] [place], [facility], [address] [place], phone [phone] or [phone].  Monday - Friday, call for an appointment. [place], [facility], [address], [place], phone [phone] or [phone].  Monday - Friday, call for an appointment.  Abuse/Neglect: [place] [phone] [place] [phone] (After Hours)  Emergency Shelter:  [facility] [phone]  Maternity Homes: [facility] [place] [phone] [place] [phone]  MRSA Hotline #:   [phone]  [facility] Dept. [address]         [place] Phone:  [phone]                                  Phone:  [phone]  Phone:  [place], [place]- [phone]       -     [facility] in [place], [address],                                  [place] [phone] or [phone] (After Hours)   [place]  Substance Abuse Resources: Alcohol and Drug Services  [place] [phone] [place] Chinita Pester [phone] Residential & Outpatient Substance Abuse Program  [phone]  Psychological Services: [place]  [phone] [place]  [place], [place]. [address], [place], [place]: [phone] or [phone], [employer]uy  Dental Assistance  If unable to pay or uninsured, contact:  [facility] or [facility] to become qualified for the adult dental clinic.  Patients with Medicaid: [facility] [phone] [address], [place] [address] [phone]  If unable to pay, or uninsured, contact [facility] [phone]) or [place] [phone] in [place], [place] in [facility]) to become qualified for the adult dental clinic   Other [place]- [place], [place], [place], [address], [place], [place], 2nd and 4th Thursday of the month at 6:30am.  10 clients each day by appointment, can sometimes see walk-in patients if someone does not show for an appointment. [facility]- [address] Hillard Danker [place], [place], [address], [place], [place], [place], [address], [place] Department- [phone] [facility] [phone]
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
## 2018-10-30 HISTORY — PX: DEEP PELVIS / HIP BIOPSY: SUR139

## 2019-09-01 DIAGNOSIS — I10 Essential (primary) hypertension: Secondary | ICD-10-CM | POA: Insufficient documentation

## 2019-09-01 DIAGNOSIS — E782 Mixed hyperlipidemia: Secondary | ICD-10-CM | POA: Insufficient documentation

## 2019-09-29 DIAGNOSIS — Z170 Estrogen receptor positive status [ER+]: Secondary | ICD-10-CM

## 2019-09-29 DIAGNOSIS — C50911 Malignant neoplasm of unspecified site of right female breast: Secondary | ICD-10-CM

## 2019-09-29 DIAGNOSIS — C773 Secondary and unspecified malignant neoplasm of axilla and upper limb lymph nodes: Secondary | ICD-10-CM

## 2019-10-04 DIAGNOSIS — C50411 Malignant neoplasm of upper-outer quadrant of right female breast: Secondary | ICD-10-CM

## 2019-10-07 ENCOUNTER — Institutional Professional Consult (permissible substitution): Payer: Medicare Other | Admitting: Plastic Surgery

## 2019-10-13 ENCOUNTER — Telehealth: Payer: Self-pay

## 2019-10-13 DIAGNOSIS — Z170 Estrogen receptor positive status [ER+]: Secondary | ICD-10-CM | POA: Diagnosis not present

## 2019-10-13 DIAGNOSIS — C773 Secondary and unspecified malignant neoplasm of axilla and upper limb lymph nodes: Secondary | ICD-10-CM

## 2019-10-13 DIAGNOSIS — C50911 Malignant neoplasm of unspecified site of right female breast: Secondary | ICD-10-CM

## 2019-10-13 NOTE — Telephone Encounter (Signed)

## 2019-10-14 ENCOUNTER — Encounter: Payer: Self-pay | Admitting: Plastic Surgery

## 2019-10-14 ENCOUNTER — Ambulatory Visit (INDEPENDENT_AMBULATORY_CARE_PROVIDER_SITE_OTHER): Payer: Medicare Other | Admitting: Plastic Surgery

## 2019-10-14 ENCOUNTER — Other Ambulatory Visit: Payer: Self-pay

## 2019-10-14 VITALS — BP 130/69 | HR 84 | Temp 97.7°F | Ht 62.0 in | Wt 135.8 lb

## 2019-10-14 DIAGNOSIS — C50919 Malignant neoplasm of unspecified site of unspecified female breast: Secondary | ICD-10-CM

## 2019-10-14 DIAGNOSIS — Z9013 Acquired absence of bilateral breasts and nipples: Secondary | ICD-10-CM | POA: Diagnosis not present
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
## 2019-10-30 DIAGNOSIS — C50411 Malignant neoplasm of upper-outer quadrant of right female breast: Secondary | ICD-10-CM

## 2019-11-18 IMAGING — CT CT ANGIO CHEST
3 of 7 series · 18 of 36 positions shown · IV contrast (Omni 300)
Comparison: 05/29/2017 CT

CLINICAL DATA: Shortness of breath. Pneumonia reportedly seen at
[HOSPITAL].

EXAM:
CT ANGIOGRAPHY CHEST WITH CONTRAST
TECHNIQUE: Multidetector CT imaging of the chest was performed using the
standard protocol during bolus administration of intravenous
contrast. Multiplanar CT image reconstructions and MIPs were
obtained to evaluate the vascular anatomy.
CONTRAST:  100mL D98G39-CBR IOPAMIDOL (D98G39-CBR) INJECTION 76%

[Series 6: pe thins · axial · 0.70mm/px · z∈[+998,+1232]mm · 13 of 274 slices shown]
[im 20/274  lung]
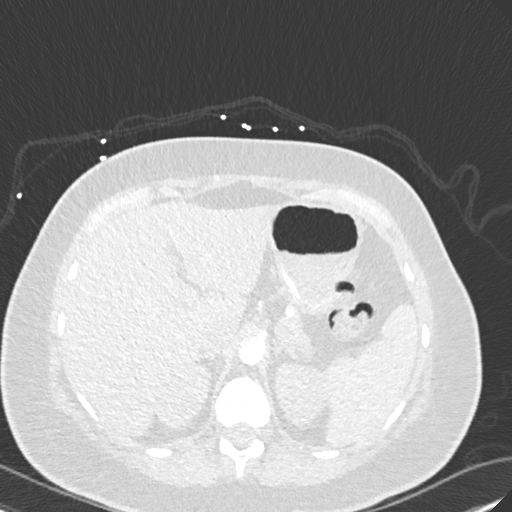
[im 40/274  mediastinal]
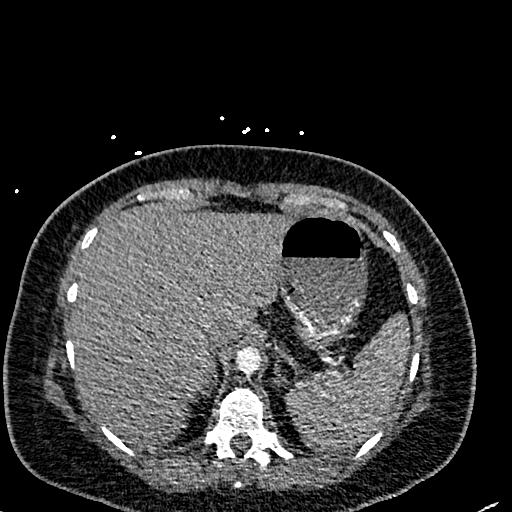
[im 59/274  lung]
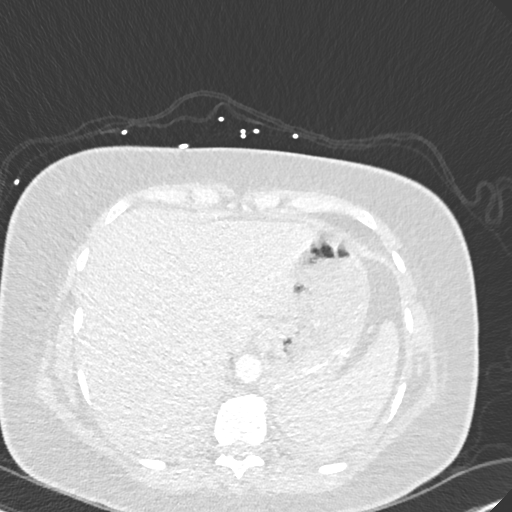
[im 79/274  mediastinal]
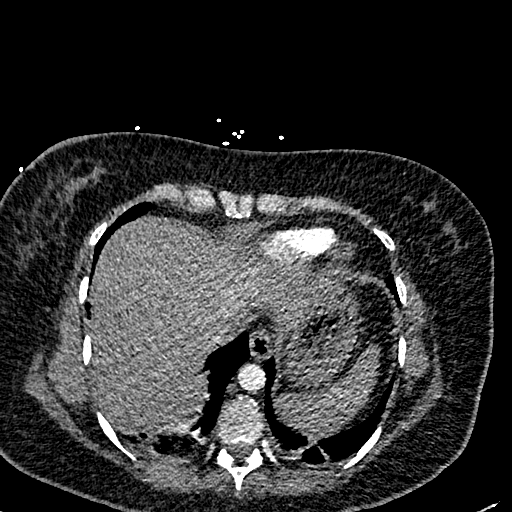
[im 98/274  lung]
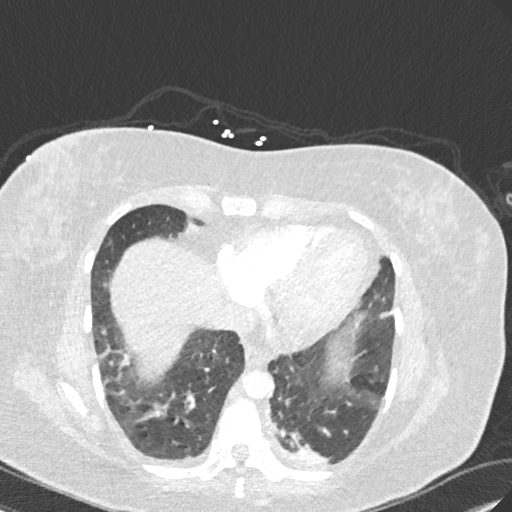
[im 118/274  mediastinal]
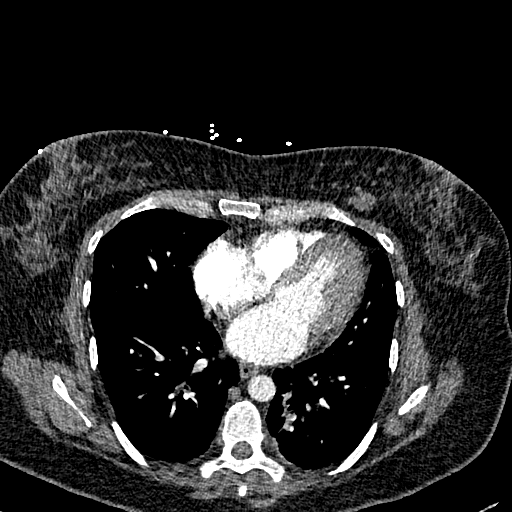
[im 137/274  lung]
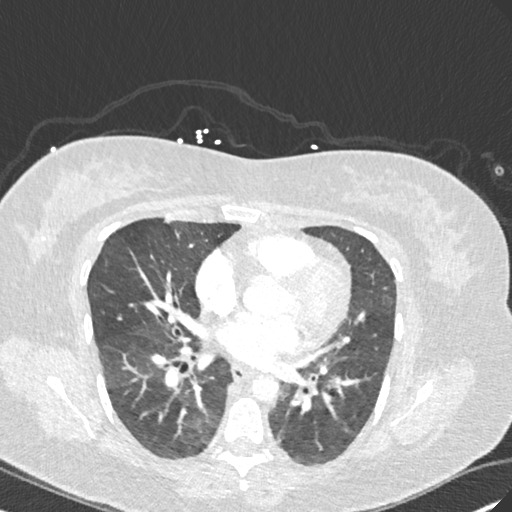
[im 157/274  mediastinal]
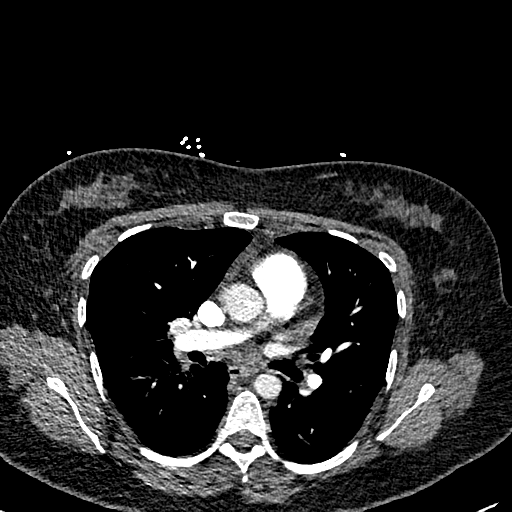
[im 176/274  lung]
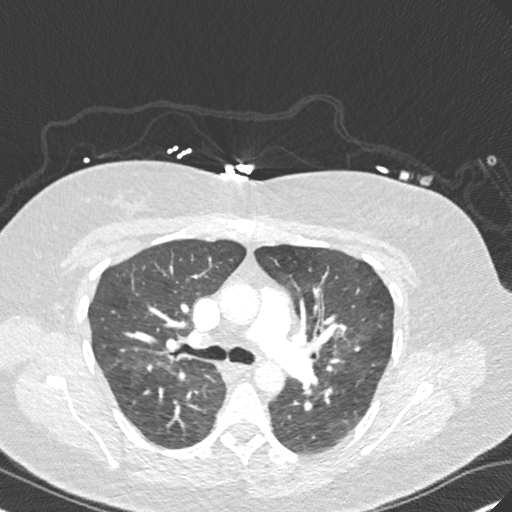
[im 196/274  mediastinal]
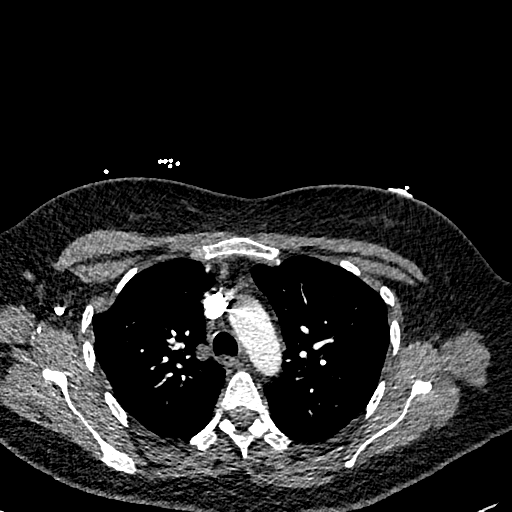
[im 215/274  lung]
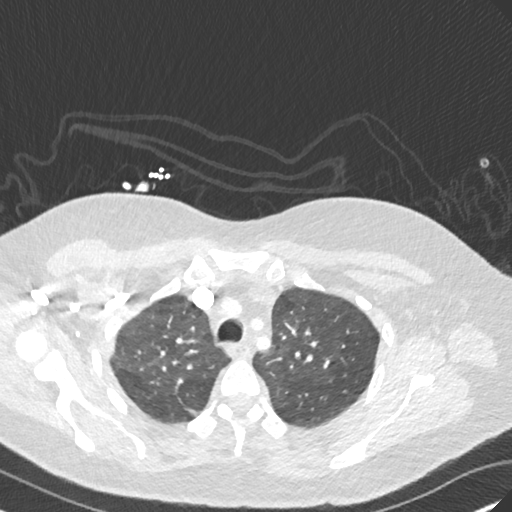
[im 235/274  mediastinal]
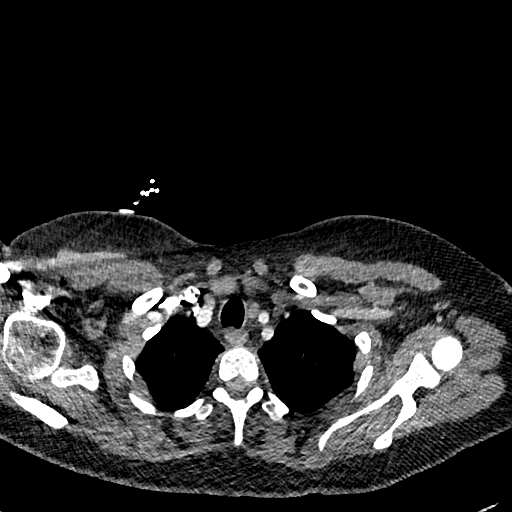
[im 254/274  lung]
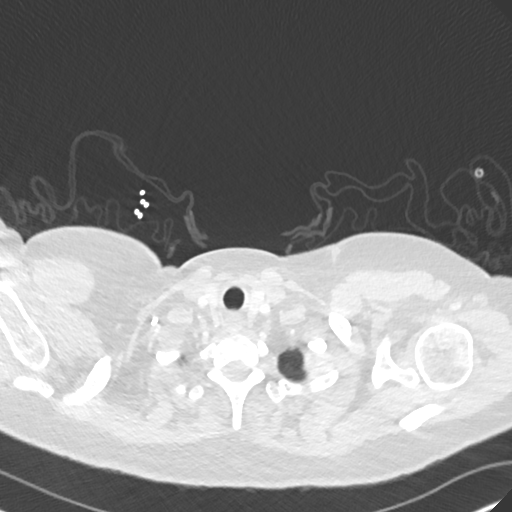

[Series 7: pe lung · axial · 0.70mm/px · z∈[+1068,+1206]mm · 4 of 116 slices shown]
[im 24/116  mediastinal]
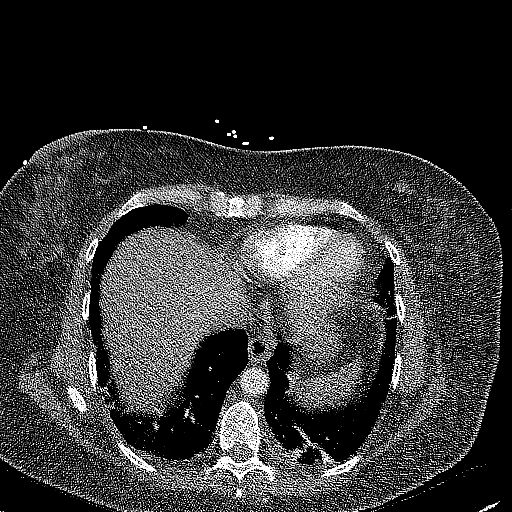
[im 47/116  mediastinal]
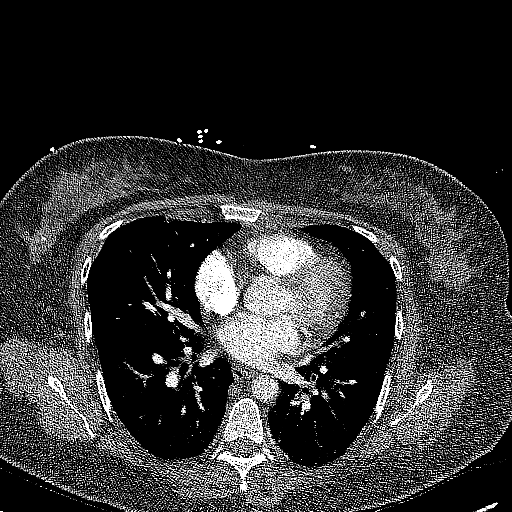
[im 70/116  mediastinal]
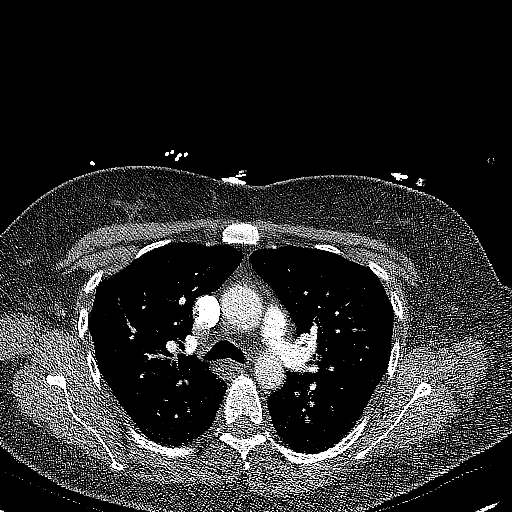
[im 93/116  mediastinal]
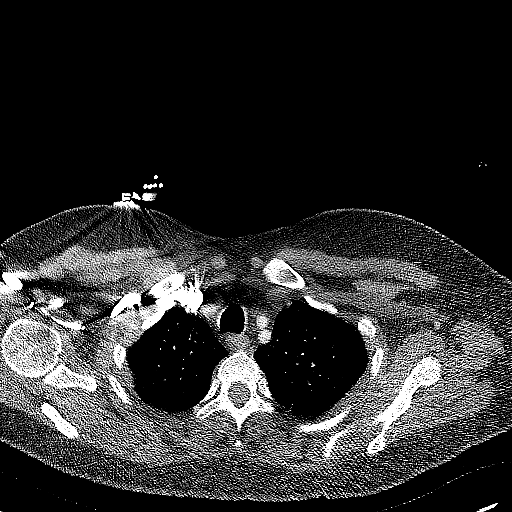

[Series 8: pe 2mm cor · coronal · 0.59mm/px · 1 of 150 slices shown]
[im 75/150  mediastinal]
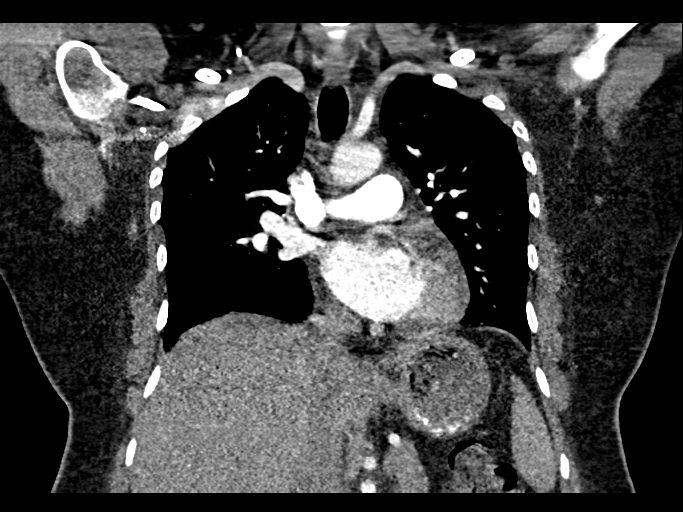

[18 of 36 positions shown; findings below may reference images not displayed]

FINDINGS: Cardiovascular: Satisfactory opacification of the pulmonary arteries
to the segmental level. No evidence of pulmonary embolism. Normal
heart size. No pericardial effusion.

Mediastinum/Nodes: Negative for adenopathy or mass.

Bilateral breast nodularity.  Recent breast cancer treatment.

Lungs/Pleura: Diffuse airway thickening with mucoid impaction of
segmental airways in the lower lobes. Mosaic attenuation of the
lungs attributed to air trapping. There is also subsegmental
atelectasis. 4 mm left upper lobe pulmonary nodule, stable from
05/29/2017 CT. No pneumonia or edema.

Upper Abdomen: Negative

Musculoskeletal: No acute or aggressive finding.

Review of the MIP images confirms the above findings.
IMPRESSION: 1. Negative for pulmonary embolism.
2. Bronchitis with mucoid impaction, atelectasis, and patchy air
trapping.

## 2019-11-19 ENCOUNTER — Telehealth: Payer: Self-pay | Admitting: Allergy

## 2019-11-19 NOTE — Telephone Encounter (Signed)
Patient called and stated she moved into a house 10 yrs ago and was diagnosed w/breast cancer which is not genetic.  Patient believes house contains mold, moisture.  Patient has consistent runny note, shortness of breath.  Patient would like to be tested for mold exposure.  Patient hasn't been seen in 2 years.  Should office visit or allergy test be scheduled?  Please advise.

## 2019-11-19 NOTE — Telephone Encounter (Signed)
Patient will need an office visit to discuss. Please have her scheduled in an allergy testing slot and remain off of antihistamines in the event that the provider does want to test. Thank you.

## 2019-11-21 NOTE — Telephone Encounter (Signed)
Left voicemail for patient to call back to schedule an allergy test for mold.

## 2019-11-25 ENCOUNTER — Encounter: Payer: Medicare Other | Admitting: Plastic Surgery
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
## 2019-11-28 ENCOUNTER — Encounter: Payer: Self-pay | Admitting: Plastic Surgery

## 2019-11-28 ENCOUNTER — Other Ambulatory Visit: Payer: Self-pay

## 2019-11-28 ENCOUNTER — Ambulatory Visit (INDEPENDENT_AMBULATORY_CARE_PROVIDER_SITE_OTHER): Payer: Medicare Other | Admitting: Plastic Surgery

## 2019-11-28 VITALS — BP 131/88 | HR 98 | Temp 98.2°F | Ht 62.0 in | Wt 140.2 lb

## 2019-11-28 DIAGNOSIS — Z9013 Acquired absence of bilateral breasts and nipples: Secondary | ICD-10-CM

## 2019-11-28 MED ORDER — ENOXAPARIN SODIUM 40 MG/0.4ML ~~LOC~~ SOLN: 40.0000 mg | SUBCUTANEOUS | 0 refills | Status: DC

## 2019-11-28 MED ORDER — CEPHALEXIN 500 MG PO CAPS: 500.0000 mg | ORAL_CAPSULE | Freq: Four times a day (QID) | ORAL | 0 refills | Status: AC

## 2019-11-28 MED ORDER — ONDANSETRON HCL 4 MG PO TABS: 4.0000 mg | ORAL_TABLET | Freq: Three times a day (TID) | ORAL | 0 refills | Status: DC | PRN
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
## 2019-12-02 ENCOUNTER — Ambulatory Visit: Payer: BLUE CROSS/BLUE SHIELD | Admitting: Allergy

## 2019-12-04 ENCOUNTER — Telehealth: Payer: Self-pay | Admitting: Plastic Surgery

## 2019-12-04 NOTE — Telephone Encounter (Signed)

## 2019-12-05 ENCOUNTER — Other Ambulatory Visit: Payer: Self-pay

## 2019-12-05 ENCOUNTER — Encounter: Payer: Self-pay | Admitting: Plastic Surgery

## 2019-12-05 ENCOUNTER — Ambulatory Visit (INDEPENDENT_AMBULATORY_CARE_PROVIDER_SITE_OTHER): Payer: Medicare Other | Admitting: Plastic Surgery

## 2019-12-05 VITALS — BP 129/85 | HR 97 | Temp 97.1°F | Ht 63.0 in | Wt 144.6 lb

## 2019-12-05 DIAGNOSIS — Z9013 Acquired absence of bilateral breasts and nipples: Secondary | ICD-10-CM

## 2019-12-05 NOTE — Progress Notes (Signed)
Monica Poole is scheduled for exchange of tissue expanders to implants on 12/17/2019 with Dr. Marla Roe.  We have been unable to obtain documentation from previous surgeon's office as to how much saline is inside the tissue expanders.  Today we drained all of the saline (370 cc) from the right tissue expander using sterile technique.  We then placed 370 cc of new saline into the right tissue expander using sterile technique.  Since the left breast appears considerably smaller than the right breast we added 100 cc of saline using sterile technique to the left tissue expander. Breasts now appear symmetric.

## 2019-12-08 ENCOUNTER — Encounter: Payer: Self-pay | Admitting: Plastic Surgery

## 2019-12-08 ENCOUNTER — Ambulatory Visit: Payer: Medicare Other | Admitting: Allergy and Immunology

## 2019-12-08 ENCOUNTER — Ambulatory Visit (INDEPENDENT_AMBULATORY_CARE_PROVIDER_SITE_OTHER): Payer: Medicare Other | Admitting: Plastic Surgery

## 2019-12-08 ENCOUNTER — Other Ambulatory Visit: Payer: Self-pay

## 2019-12-08 VITALS — BP 112/73 | HR 96 | Temp 97.1°F | Ht 63.0 in | Wt 144.0 lb

## 2019-12-08 DIAGNOSIS — Z9013 Acquired absence of bilateral breasts and nipples: Secondary | ICD-10-CM

## 2019-12-08 MED ORDER — CEPHALEXIN 500 MG PO CAPS: 500.0000 mg | ORAL_CAPSULE | Freq: Four times a day (QID) | ORAL | 0 refills | Status: AC

## 2019-12-08 NOTE — Progress Notes (Signed)
Monica Poole is scheduled to have her tissue expanders exchanged for implants on 12/17/19 with Dr. Marla Roe. Friday we drained the fluid from the right tissue expander (370 cc) and replaced it with 370 cc saline using sterile technique and added 100 cc of saline to the left tissue expander using sterile technique to improve symmetry of the breasts. Saturday patient reports breasts were red and tender. Sunday, she was started on Keflex 500 mg QID x 3 days.  Today she presents with redness, warmth, and tenderness of bilateral breasts. Patient preferred to not drain any fluid from the breasts today.  Denies fever, cold symptoms, SOB.  Sent additional 4 days of Keflex 500 mg QID to pharmacy.  Patient to call office tomorrow if condition does not improve. Follow up Thursday.

## 2019-12-10 DIAGNOSIS — C50411 Malignant neoplasm of upper-outer quadrant of right female breast: Secondary | ICD-10-CM

## 2019-12-10 DIAGNOSIS — C7951 Secondary malignant neoplasm of bone: Secondary | ICD-10-CM

## 2019-12-11 ENCOUNTER — Other Ambulatory Visit (INDEPENDENT_AMBULATORY_CARE_PROVIDER_SITE_OTHER): Payer: Medicare Other | Admitting: Plastic Surgery

## 2019-12-11 ENCOUNTER — Encounter (HOSPITAL_BASED_OUTPATIENT_CLINIC_OR_DEPARTMENT_OTHER): Payer: Self-pay | Admitting: Plastic Surgery

## 2019-12-11 ENCOUNTER — Other Ambulatory Visit: Payer: Self-pay

## 2019-12-11 ENCOUNTER — Ambulatory Visit: Payer: Medicare Other | Admitting: Plastic Surgery

## 2019-12-11 DIAGNOSIS — C50919 Malignant neoplasm of unspecified site of unspecified female breast: Secondary | ICD-10-CM

## 2019-12-11 NOTE — Progress Notes (Signed)
Placement of order for CBC with Differential to check WBC count prior to surgery

## 2019-12-13 ENCOUNTER — Other Ambulatory Visit (HOSPITAL_COMMUNITY)
Admission: RE | Admit: 2019-12-13 | Discharge: 2019-12-13 | Disposition: A | Discharge status: Home / Self Care | Payer: Medicare Other | Source: Ambulatory Visit | Attending: Plastic Surgery | Admitting: Plastic Surgery

## 2019-12-13 DIAGNOSIS — Z20822 Contact with and (suspected) exposure to covid-19: Secondary | ICD-10-CM | POA: Diagnosis not present

## 2019-12-13 DIAGNOSIS — Z01812 Encounter for preprocedural laboratory examination: Secondary | ICD-10-CM | POA: Insufficient documentation

## 2019-12-13 LAB — SARS CORONAVIRUS 2 (TAT 6-24 HRS): SARS Coronavirus 2: NEGATIVE

## 2019-12-15 ENCOUNTER — Telehealth: Payer: Self-pay | Admitting: *Deleted

## 2019-12-15 NOTE — Telephone Encounter (Signed)
Faxed orders on (12/12/19) to [facility] for patient to go on Monday (12/15/19),and have lab work drawn.  Confirmation received.//AB/CMA

## 2019-12-16 ENCOUNTER — Telehealth (INDEPENDENT_AMBULATORY_CARE_PROVIDER_SITE_OTHER): Payer: Medicare Other | Admitting: Plastic Surgery

## 2019-12-16 ENCOUNTER — Other Ambulatory Visit: Payer: Self-pay

## 2019-12-16 DIAGNOSIS — Z9013 Acquired absence of bilateral breasts and nipples: Secondary | ICD-10-CM

## 2019-12-16 LAB — CBC WITH DIFFERENTIAL/PLATELET
Basophils Absolute: 0.1 10*3/uL (ref 0.0–0.2)
Basos: 2 %
EOS (ABSOLUTE): 0 10*3/uL (ref 0.0–0.4)
Eos: 1 %
Hematocrit: 41.1 % (ref 34.0–46.6)
Hemoglobin: 13.8 g/dL (ref 11.1–15.9)
Immature Grans (Abs): 0 10*3/uL (ref 0.0–0.1)
Immature Granulocytes: 1 %
Lymphocytes Absolute: 0.8 10*3/uL (ref 0.7–3.1)
Lymphs: 24 %
MCH: 33.8 pg — ABNORMAL HIGH (ref 26.6–33.0)
MCHC: 33.6 g/dL (ref 31.5–35.7)
MCV: 101 fL — ABNORMAL HIGH (ref 79–97)
Monocytes Absolute: 0.4 10*3/uL (ref 0.1–0.9)
Monocytes: 12 %
Neutrophils Absolute: 2.1 10*3/uL (ref 1.4–7.0)
Neutrophils: 60 %
Platelets: 297 10*3/uL (ref 150–450)
RBC: 4.08 x10E6/uL (ref 3.77–5.28)
RDW: 17.9 % — ABNORMAL HIGH (ref 11.7–15.4)
WBC: 3.5 10*3/uL (ref 3.4–10.8)

## 2019-12-16 NOTE — Progress Notes (Signed)
The patient is joining me by phone today.  We are planning on doing a bilateral exchange of expander for implant.  Her first surgery was done elsewhere.  We drained the fluid out of the expander and got around 370 cc.  We will plan on doing the exchange tomorrow.  I hesitate because the patient's white count was low last week.  A repeat yesterday showed some improvement.  The patient has pleaded to not have the surgery canceled.  She states she feels fine.  She is having friend come and stay with her for the surgery.  I spoke with her and told her that her safety is my priority.  And that after the surgery she must stay home and not be around anybody who is ill.

## 2019-12-16 NOTE — H&P (View-Only) (Signed)
The patient is joining me by phone today.  We are planning on doing a bilateral exchange of expander for implant.  Her first surgery was done elsewhere.  We drained the fluid out of the expander and got around 370 cc.  We will plan on doing the exchange tomorrow.  I hesitate because the patient's white count was low last week.  A repeat yesterday showed some improvement.  The patient has pleaded to not have the surgery canceled.  She states she feels fine.  She is having friend come and stay with her for the surgery.  I spoke with her and told her that her safety is my priority.  And that after the surgery she must stay home and not be around anybody who is ill.

## 2019-12-17 ENCOUNTER — Ambulatory Visit (HOSPITAL_BASED_OUTPATIENT_CLINIC_OR_DEPARTMENT_OTHER)
Admission: RE | Admit: 2019-12-17 | Discharge: 2019-12-17 | Disposition: A | Discharge status: Home / Self Care | Payer: Medicare Other | Attending: Plastic Surgery | Admitting: Plastic Surgery

## 2019-12-17 ENCOUNTER — Encounter (HOSPITAL_BASED_OUTPATIENT_CLINIC_OR_DEPARTMENT_OTHER)
Admission: RE | Disposition: A | Discharge status: Home / Self Care | Payer: Self-pay | Source: Home / Self Care | Attending: Plastic Surgery

## 2019-12-17 ENCOUNTER — Ambulatory Visit (HOSPITAL_BASED_OUTPATIENT_CLINIC_OR_DEPARTMENT_OTHER): Payer: Medicare Other | Admitting: Anesthesiology

## 2019-12-17 ENCOUNTER — Encounter (HOSPITAL_BASED_OUTPATIENT_CLINIC_OR_DEPARTMENT_OTHER): Payer: Self-pay | Admitting: Plastic Surgery

## 2019-12-17 ENCOUNTER — Other Ambulatory Visit: Payer: Self-pay

## 2019-12-17 DIAGNOSIS — R519 Headache, unspecified: Secondary | ICD-10-CM | POA: Insufficient documentation

## 2019-12-17 DIAGNOSIS — F329 Major depressive disorder, single episode, unspecified: Secondary | ICD-10-CM | POA: Diagnosis not present

## 2019-12-17 DIAGNOSIS — J45909 Unspecified asthma, uncomplicated: Secondary | ICD-10-CM | POA: Diagnosis not present

## 2019-12-17 DIAGNOSIS — Z9013 Acquired absence of bilateral breasts and nipples: Secondary | ICD-10-CM | POA: Insufficient documentation

## 2019-12-17 DIAGNOSIS — Z421 Encounter for breast reconstruction following mastectomy: Secondary | ICD-10-CM | POA: Diagnosis not present

## 2019-12-17 DIAGNOSIS — K219 Gastro-esophageal reflux disease without esophagitis: Secondary | ICD-10-CM | POA: Insufficient documentation

## 2019-12-17 DIAGNOSIS — F419 Anxiety disorder, unspecified: Secondary | ICD-10-CM | POA: Insufficient documentation

## 2019-12-17 DIAGNOSIS — M1990 Unspecified osteoarthritis, unspecified site: Secondary | ICD-10-CM | POA: Insufficient documentation

## 2019-12-17 DIAGNOSIS — Z853 Personal history of malignant neoplasm of breast: Secondary | ICD-10-CM | POA: Insufficient documentation

## 2019-12-17 DIAGNOSIS — C50919 Malignant neoplasm of unspecified site of unspecified female breast: Secondary | ICD-10-CM

## 2019-12-17 HISTORY — PX: REMOVAL OF BILATERAL TISSUE EXPANDERS WITH PLACEMENT OF BILATERAL BREAST IMPLANTS: SHX6431

## 2019-12-17 LAB — POCT PREGNANCY, URINE: Preg Test, Ur: NEGATIVE

## 2019-12-17 SURGERY — REMOVAL, TISSUE EXPANDER, BREAST, BILATERAL, WITH BILATERAL IMPLANT IMPLANT INSERTION
Anesthesia: General | Site: Breast | Laterality: Bilateral

## 2019-12-17 MED ORDER — SUCCINYLCHOLINE CHLORIDE 20 MG/ML IJ SOLN
INTRAMUSCULAR | Status: DC | PRN
  Administered 2019-12-17: 160 mg via INTRAVENOUS

## 2019-12-17 MED ORDER — DEXAMETHASONE SODIUM PHOSPHATE 4 MG/ML IJ SOLN
INTRAMUSCULAR | Status: DC | PRN
  Administered 2019-12-17: 8 mg via INTRAVENOUS

## 2019-12-17 MED ORDER — ACETAMINOPHEN 500 MG PO TABS
1000.0000 mg | ORAL_TABLET | Freq: Once | ORAL | Status: AC
  Administered 2019-12-17: 1000 mg via ORAL

## 2019-12-17 MED ORDER — LIDOCAINE-EPINEPHRINE 1 %-1:100000 IJ SOLN
INTRAMUSCULAR | Status: AC
  Filled 2019-12-17: qty 1

## 2019-12-17 MED ORDER — ACETAMINOPHEN 650 MG RE SUPP: 650.0000 mg | RECTAL | Status: DC | PRN

## 2019-12-17 MED ORDER — MIDAZOLAM HCL 5 MG/5ML IJ SOLN
INTRAMUSCULAR | Status: DC | PRN
  Administered 2019-12-17: 2 mg via INTRAVENOUS

## 2019-12-17 MED ORDER — ONDANSETRON HCL 4 MG/2ML IJ SOLN
INTRAMUSCULAR | Status: DC | PRN
  Administered 2019-12-17: 4 mg via INTRAVENOUS

## 2019-12-17 MED ORDER — SODIUM CHLORIDE 0.9% FLUSH: 3.0000 mL | INTRAVENOUS | Status: DC | PRN

## 2019-12-17 MED ORDER — SODIUM CHLORIDE 0.9 % IV SOLN: 250.0000 mL | INTRAVENOUS | Status: DC | PRN

## 2019-12-17 MED ORDER — ACETAMINOPHEN 500 MG PO TABS
ORAL_TABLET | ORAL | Status: AC
  Filled 2019-12-17: qty 2

## 2019-12-17 MED ORDER — CEFAZOLIN SODIUM-DEXTROSE 2-4 GM/100ML-% IV SOLN
INTRAVENOUS | Status: AC
  Filled 2019-12-17: qty 100

## 2019-12-17 MED ORDER — FENTANYL CITRATE (PF) 100 MCG/2ML IJ SOLN
INTRAMUSCULAR | Status: DC | PRN
  Administered 2019-12-17: 100 ug via INTRAVENOUS
  Administered 2019-12-17: 50 ug via INTRAVENOUS

## 2019-12-17 MED ORDER — PROPOFOL 10 MG/ML IV BOLUS
INTRAVENOUS | Status: DC | PRN
  Administered 2019-12-17: 200 mg via INTRAVENOUS

## 2019-12-17 MED ORDER — SODIUM CHLORIDE 0.9 % IV SOLN
INTRAVENOUS | Status: AC
  Filled 2019-12-17: qty 500000

## 2019-12-17 MED ORDER — MIDAZOLAM HCL 2 MG/2ML IJ SOLN
INTRAMUSCULAR | Status: AC
  Filled 2019-12-17: qty 2

## 2019-12-17 MED ORDER — LACTATED RINGERS IV SOLN: INTRAVENOUS | Status: DC

## 2019-12-17 MED ORDER — LIDOCAINE HCL (CARDIAC) PF 100 MG/5ML IV SOSY
PREFILLED_SYRINGE | INTRAVENOUS | Status: DC | PRN
  Administered 2019-12-17: 40 mg via INTRAVENOUS

## 2019-12-17 MED ORDER — SODIUM CHLORIDE 0.9% FLUSH: 3.0000 mL | Freq: Two times a day (BID) | INTRAVENOUS | Status: DC

## 2019-12-17 MED ORDER — LIDOCAINE-EPINEPHRINE 1 %-1:100000 IJ SOLN
INTRAMUSCULAR | Status: DC | PRN
  Administered 2019-12-17: 7 mL

## 2019-12-17 MED ORDER — ROCURONIUM BROMIDE 10 MG/ML (PF) SYRINGE
PREFILLED_SYRINGE | INTRAVENOUS | Status: AC
  Filled 2019-12-17: qty 10

## 2019-12-17 MED ORDER — CHLORHEXIDINE GLUCONATE CLOTH 2 % EX PADS: 6.0000 | MEDICATED_PAD | Freq: Once | CUTANEOUS | Status: DC

## 2019-12-17 MED ORDER — PROPOFOL 10 MG/ML IV BOLUS
INTRAVENOUS | Status: AC
  Filled 2019-12-17: qty 20

## 2019-12-17 MED ORDER — FENTANYL CITRATE (PF) 100 MCG/2ML IJ SOLN: 25.0000 ug | INTRAMUSCULAR | Status: DC | PRN

## 2019-12-17 MED ORDER — OXYCODONE HCL 5 MG PO TABS: 5.0000 mg | ORAL_TABLET | ORAL | Status: DC | PRN

## 2019-12-17 MED ORDER — PROPOFOL 500 MG/50ML IV EMUL
INTRAVENOUS | Status: AC
  Filled 2019-12-17: qty 100

## 2019-12-17 MED ORDER — FENTANYL CITRATE (PF) 100 MCG/2ML IJ SOLN
INTRAMUSCULAR | Status: AC
  Filled 2019-12-17: qty 2

## 2019-12-17 MED ORDER — SODIUM CHLORIDE 0.9 % IV SOLN
INTRAVENOUS | Status: DC | PRN
  Administered 2019-12-17: 500 mL

## 2019-12-17 MED ORDER — EPHEDRINE SULFATE 50 MG/ML IJ SOLN
INTRAMUSCULAR | Status: DC | PRN
  Administered 2019-12-17: 15 mg via INTRAVENOUS

## 2019-12-17 MED ORDER — ROCURONIUM BROMIDE 100 MG/10ML IV SOLN
INTRAVENOUS | Status: DC | PRN
  Administered 2019-12-17: 20 mg via INTRAVENOUS

## 2019-12-17 MED ORDER — CEFAZOLIN SODIUM-DEXTROSE 2-4 GM/100ML-% IV SOLN
2.0000 g | INTRAVENOUS | Status: AC
  Administered 2019-12-17: 2 g via INTRAVENOUS

## 2019-12-17 MED ORDER — ACETAMINOPHEN 325 MG PO TABS: 650.0000 mg | ORAL_TABLET | ORAL | Status: DC | PRN

## 2019-12-17 SURGICAL SUPPLY — 67 items
BAG DECANTER FOR FLEXI CONT (MISCELLANEOUS) ×2 IMPLANT
BINDER BREAST LRG (GAUZE/BANDAGES/DRESSINGS) ×2 IMPLANT
BINDER BREAST MEDIUM (GAUZE/BANDAGES/DRESSINGS) IMPLANT
BINDER BREAST XLRG (GAUZE/BANDAGES/DRESSINGS) IMPLANT
BINDER BREAST XXLRG (GAUZE/BANDAGES/DRESSINGS) IMPLANT
BIOPATCH RED 1 DISK 7.0 (GAUZE/BANDAGES/DRESSINGS) IMPLANT
BLADE HEX COATED 2.75 (ELECTRODE) ×2 IMPLANT
BLADE SURG 15 STRL LF DISP TIS (BLADE) ×2 IMPLANT
BLADE SURG 15 STRL SS (BLADE) ×2
CANISTER SUCT 1200ML W/VALVE (MISCELLANEOUS) ×4 IMPLANT
CORD BIPOLAR FORCEPS 12FT (ELECTRODE) IMPLANT
COVER BACK TABLE 60X90IN (DRAPES) ×2 IMPLANT
COVER MAYO STAND STRL (DRAPES) ×2 IMPLANT
COVER WAND RF STERILE (DRAPES) IMPLANT
DECANTER SPIKE VIAL GLASS SM (MISCELLANEOUS) IMPLANT
DERMABOND ADVANCED (GAUZE/BANDAGES/DRESSINGS) ×1
DERMABOND ADVANCED .7 DNX12 (GAUZE/BANDAGES/DRESSINGS) ×1 IMPLANT
DRAIN CHANNEL 19F RND (DRAIN) IMPLANT
DRAPE LAPAROSCOPIC ABDOMINAL (DRAPES) ×2 IMPLANT
DRSG PAD ABDOMINAL 8X10 ST (GAUZE/BANDAGES/DRESSINGS) ×8 IMPLANT
ELECT BLADE 4.0 EZ CLEAN MEGAD (MISCELLANEOUS) ×2
ELECT REM PT RETURN 9FT ADLT (ELECTROSURGICAL) ×2
ELECTRODE BLDE 4.0 EZ CLN MEGD (MISCELLANEOUS) ×1 IMPLANT
ELECTRODE REM PT RTRN 9FT ADLT (ELECTROSURGICAL) ×1 IMPLANT
EVACUATOR SILICONE 100CC (DRAIN) IMPLANT
GAUZE SPONGE 4X4 12PLY STRL LF (GAUZE/BANDAGES/DRESSINGS) IMPLANT
GLOVE BIO SURGEON STRL SZ 6.5 (GLOVE) IMPLANT
GLOVE BIO SURGEON STRL SZ7 (GLOVE) IMPLANT
GLOVE SURG SS PI 6.5 STRL IVOR (GLOVE) ×8 IMPLANT
GLOVE SURG SS PI 7.0 STRL IVOR (GLOVE) ×2 IMPLANT
GOWN STRL REUS W/ TWL LRG LVL3 (GOWN DISPOSABLE) ×3 IMPLANT
GOWN STRL REUS W/ TWL XL LVL3 (GOWN DISPOSABLE) ×1 IMPLANT
GOWN STRL REUS W/TWL LRG LVL3 (GOWN DISPOSABLE) ×3
GOWN STRL REUS W/TWL XL LVL3 (GOWN DISPOSABLE) ×1
IMPL BREAST GEL 380CC (Breast) ×2 IMPLANT
IMPLANT BREAST GEL 380CC (Breast) ×4 IMPLANT
IV NS 1000ML (IV SOLUTION)
IV NS 1000ML BAXH (IV SOLUTION) IMPLANT
IV NS 500ML (IV SOLUTION)
IV NS 500ML BAXH (IV SOLUTION) IMPLANT
KIT FILL SYSTEM UNIVERSAL (SET/KITS/TRAYS/PACK) IMPLANT
NDL SAFETY ECLIPSE 18X1.5 (NEEDLE) ×1 IMPLANT
NEEDLE HYPO 18GX1.5 SHARP (NEEDLE) ×1
NEEDLE HYPO 25X1 1.5 SAFETY (NEEDLE) ×2 IMPLANT
PACK BASIN DAY SURGERY FS (CUSTOM PROCEDURE TRAY) ×2 IMPLANT
PENCIL SMOKE EVACUATOR (MISCELLANEOUS) ×2 IMPLANT
PIN SAFETY STERILE (MISCELLANEOUS) IMPLANT
SIZER BREAST REUSE 380CC (SIZER) ×2
SIZER BRST REUSE 380CC (SIZER) ×1 IMPLANT
SLEEVE SCD COMPRESS KNEE MED (MISCELLANEOUS) ×2 IMPLANT
SPONGE LAP 18X18 RF (DISPOSABLE) ×6 IMPLANT
STRIP SUTURE WOUND CLOSURE 1/2 (MISCELLANEOUS) ×4 IMPLANT
SUT MNCRL AB 4-0 PS2 18 (SUTURE) ×6 IMPLANT
SUT MON AB 3-0 SH 27 (SUTURE) ×3
SUT MON AB 3-0 SH27 (SUTURE) ×3 IMPLANT
SUT MON AB 5-0 PS2 18 (SUTURE) ×4 IMPLANT
SUT PDS AB 2-0 CT2 27 (SUTURE) IMPLANT
SUT VIC AB 3-0 SH 27 (SUTURE)
SUT VIC AB 3-0 SH 27X BRD (SUTURE) IMPLANT
SUT VICRYL 4-0 PS2 18IN ABS (SUTURE) IMPLANT
SYR BULB IRRIGATION 50ML (SYRINGE) ×2 IMPLANT
SYR CONTROL 10ML LL (SYRINGE) ×2 IMPLANT
TOWEL GREEN STERILE FF (TOWEL DISPOSABLE) ×4 IMPLANT
TRAY DSU PREP LF (CUSTOM PROCEDURE TRAY) ×2 IMPLANT
TUBE CONNECTING 20X1/4 (TUBING) ×2 IMPLANT
UNDERPAD 30X36 HEAVY ABSORB (UNDERPADS AND DIAPERS) ×4 IMPLANT
YANKAUER SUCT BULB TIP NO VENT (SUCTIONS) ×2 IMPLANT

## 2019-12-17 NOTE — Anesthesia Procedure Notes (Signed)
Procedure Name: Intubation Date/Time: 12/17/2019 1:09 PM Performed by: Willa Frater, CRNA Pre-anesthesia Checklist: Patient identified, Emergency Drugs available, Suction available and Patient being monitored Patient Re-evaluated:Patient Re-evaluated prior to induction Oxygen Delivery Method: Circle system utilized Preoxygenation: Pre-oxygenation with 100% oxygen Induction Type: IV induction Ventilation: Mask ventilation without difficulty Laryngoscope Size: Mac and 3 Grade View: Grade I Tube type: Oral Number of attempts: 1 Airway Equipment and Method: Stylet and Oral airway Placement Confirmation: ETT inserted through vocal cords under direct vision,  positive ETCO2 and breath sounds checked- equal and bilateral Tube secured with: Tape Dental Injury: Teeth and Oropharynx as per pre-operative assessment

## 2019-12-17 NOTE — Op Note (Addendum)
Op report Bilateral Exchange   DATE OF OPERATION: 12/17/2019  LOCATION: [place]  SURGICAL DIVISION: Plastic Surgery  PREOPERATIVE DIAGNOSES:  1.History of breast cancer.  2. Acquired absence of bilateral breast.   POSTOPERATIVE DIAGNOSES:  1. History of breast cancer.  2. Acquired absence of bilateral breast.   PROCEDURE:  1. Bilateral exchange of tissue expanders for implants.  2. Bilateral capsulotomies for implant respositioning.  SURGEON: Conal Shetley Sanger Tekoa Hamor, DO  ASSISTANT: Phoebe Sharps, PA  ANESTHESIA:  General.   COMPLICATIONS: None.   IMPLANTS: Left - Mentor Smooth High Profile Xtra Gel 380 cc. Ref #[id number].  Serial Number [id number] - Mentor Smooth High Profile Xtra Gel 380 cc. Ref #[id number].  Serial Number [id number]  INDICATIONS FOR PROCEDURE:  The patient, Monica Poole, is a 44 y.o. female born on 11/26/1975, is here for treatment after bilateral mastectomies.  She had tissue expanders placed at the time of mastectomies. She now presents for exchange of her expanders for implants.  She requires capsulotomies to better position the implants. MRN: [id number]  CONSENT:  Informed consent was obtained directly from the patient. Risks, benefits and alternatives were fully discussed. Specific risks including but not limited to bleeding, infection, hematoma, seroma, scarring, pain, implant infection, implant extrusion, capsular contracture, asymmetry, wound healing problems, and need for further surgery were all discussed. The patient did have an ample opportunity to have her questions answered to her satisfaction.   DESCRIPTION OF PROCEDURE:  The patient was taken to the operating room. SCDs were placed and IV antibiotics were given. The patient's chest was prepped and draped in a sterile fashion. A time out was performed and the implants to be used were identified.    On the right breast: One percent Lidocaine with epinephrine  was used to infiltrate at the incision site. The old mastectomy scar was incised.  The mastectomy flaps from the superior and inferior flaps were raised over the capsule and ADM for several centimeters to minimize tension for the closure. The capsule and ADM were split inferior to the skin incision to expose and remove the tissue expander.  Inspection of the pocket showed a normal healthy capsule and good integration of the biologic matrix.  The pocket was irrigated with antibiotic solution.  Circumferential capsulotomies and a partial capsulectomy was performed to allow for breast pocket expansion.  The flaps were incredible thin. Measurements were made and a sizer used to confirm adequate pocket size for the implant dimensions.  There was overlapping of the ADM laterally.  This was trimmed. Hemostasis was ensured with electrocautery. New gloves were placed. The implant was soaked in antibiotic solution and then placed in the pocket and oriented appropriately. The capsule and ADM on the anterior surface were re-closed with a 3-0 Monocryl suture. The remaining skin was closed with 4-0 Monocryl deep dermal and 5-0 Monocryl subcuticular stitches.   On the left breast: The old mastectomy scar was incised.  The mastectomy flaps from the superior and inferior flaps were raised over the capsule and ADM for several centimeters to minimize tension for the closure. The capsule and ADM were split inferior to the skin incision to expose and remove the tissue expander.  Inspection of the pocket showed a normal healthy capsule and good integration of the biologic matrix.   Circumferential capsulotomies were performed to allow for breast pocket expansion.  A partial capsulectomy was performed laterally werer there was bulk of the ADM and folding over of the material from the  original surgery.  Measurements were made and a sizer utilized to confirm adequate pocket size for the implant dimensions.  Hemostasis was ensured with  the electrocautery.  New gloves were applied. The implant was soaked in antibiotic solution and placed in the pocket and oriented appropriately. The ADM and capsule on the anterior surface were re-closed with a 3-0 Monocryl suture. The remaining skin was closed with 4-0 Monocryl deep dermal and 5-0 Monocryl subcuticular stitches.  Dermabond was applied to the incision site. A breast binder and ABDs were placed.  The patient was allowed to wake from anesthesia and taken to the recovery room in satisfactory condition.  Both breasts had extremely thin mastectomy flaps.  The advanced practice practitioner (APP) assisted throughout the case.  The APP was essential in retraction and counter traction when needed to make the case progress smoothly.  This retraction and assistance made it possible to see the tissue plans for the procedure.  The assistance was needed for blood control, tissue re-approximation and assisted with closure of the incision site.  The [place] was signed into law in 2016 which includes the topic of electronic health records.  This provides immediate access to information in MyChart.  This includes consultation notes, operative notes, office notes, lab results and pathology reports.  If you have any questions about what you read please let us know at your next visit or call us at the office.  We are right here with you.

## 2019-12-17 NOTE — Anesthesia Procedure Notes (Signed)
Performed by: Mirta Mally D, CRNA       

## 2019-12-17 NOTE — Anesthesia Preprocedure Evaluation (Addendum)
Anesthesia Evaluation  Patient identified by MRN, date of birth, ID band Patient awake    Reviewed: Allergy & Precautions, NPO status , Patient's Chart, lab work & pertinent test results  Airway Mallampati: II  TM Distance: >3 FB Neck ROM: Full    Dental no notable dental hx. (+) Teeth Intact, Dental Advisory Given   Pulmonary shortness of breath, asthma ,    Pulmonary exam normal breath sounds clear to auscultation       Cardiovascular negative cardio ROS Normal cardiovascular exam Rhythm:Regular Rate:Normal     Neuro/Psych  Headaches, PSYCHIATRIC DISORDERS Anxiety Depression    GI/Hepatic Neg liver ROS, GERD  Medicated,  Endo/Other  negative endocrine ROS  Renal/GU negative Renal ROS  negative genitourinary   Musculoskeletal  (+) Arthritis ,   Abdominal   Peds  Hematology negative hematology ROS (+)   Anesthesia Other Findings H/o right breat cancer  Reproductive/Obstetrics                            Anesthesia Physical Anesthesia Plan  ASA: II  Anesthesia Plan: General   Post-op Pain Management:    Induction: Intravenous  PONV Risk Score and Plan: 3 and Midazolam, Dexamethasone and Ondansetron  Airway Management Planned: Oral ETT  Additional Equipment:   Intra-op Plan:   Post-operative Plan: Extubation in OR  Informed Consent: I have reviewed the patients History and Physical, chart, labs and discussed the procedure including the risks, benefits and alternatives for the proposed anesthesia with the patient or authorized representative who has indicated his/her understanding and acceptance.     Dental advisory given  Plan Discussed with: CRNA  Anesthesia Plan Comments:        Anesthesia Quick Evaluation

## 2019-12-17 NOTE — Discharge Instructions (Addendum)
INSTRUCTIONS FOR AFTER SURGERY  ° °You will likely have some questions about what to expect following your operation.  The following information will help you and your family understand what to expect when you are discharged from the hospital.  Following these guidelines will help ensure a smooth recovery and reduce risks of complications.  Postoperative instructions include information on: diet, wound care, medications and physical activity. ° °AFTER SURGERY °Expect to go home after the procedure.  In some cases, you may need to spend one night in the hospital for observation. ° °DIET °This surgery does not require a specific diet.  However, I have to mention that the healthier you eat the better your body can start healing. It is important to increasing your protein intake.  This means limiting the foods with added sugar.  Focus on fruits and vegetables and some meat.  If you have any liposuction during your procedure be sure to drink water.  If your urine is bright yellow, then it is concentrated, and you need to drink more water.  As a general rule after surgery, you should have 8 ounces of water every hour while awake.  If you find you are persistently nauseated or unable to take in liquids let us know.  NO TOBACCO USE or EXPOSURE.  This will slow your healing process and increase the risk of a wound. ° °WOUND CARE °If you have a drain: Clean with baby wipes until the drain is removed.   °If you have steri-strips / tape directly attached to your skin leave them in place. It is OK to get these wet.  No baths, pools or hot tubs for two weeks. °We close your incision to leave the smallest and best-looking scar. No ointment or creams on your incisions until given the go ahead.  Especially not Neosporin (Too many skin reactions with this one).  A few weeks after surgery you can use Mederma and start massaging the scar. °We ask you to wear your binder or sports bra for the first 6 weeks around the clock, including  while sleeping. This provides added comfort and helps reduce the fluid accumulation at the surgery site. ° °ACTIVITY °No heavy lifting until cleared by the doctor.  It is OK to walk and climb stairs. In fact, moving your legs is very important to decrease your risk of a blood clot.  It will also help keep you from getting deconditioned.  Every 1 to 2 hours get up and walk for 5 minutes. This will help with a quicker recovery back to normal.  Let pain be your guide so you don't do too much.  NO, you cannot do the spring cleaning and don't plan on taking care of anyone else.  This is your time for TLC.  ° °WORK °Everyone returns to work at different times. As a rough guide, most people take at least 1 - 2 weeks off prior to returning to work. If you need documentation for your job, bring the forms to your postoperative follow up visit. ° °DRIVING °Arrange for someone to bring you home from the hospital.  You may be able to drive a few days after surgery but not while taking any narcotics or valium. ° °BOWEL MOVEMENTS °Constipation can occur after anesthesia and while taking pain medication.  It is important to stay ahead for your comfort.  We recommend taking Milk of Magnesia (2 tablespoons; twice a day) while taking the pain pills. ° °SEROMA °This is fluid your body tried   to put in the surgical site.  This is normal but if it creates excessive pain and swelling let us know.  It usually decreases in a few weeks.  MEDICATIONS and PAIN CONTROL At your preoperative visit for you history and physical you were given the following medications: 1. An antibiotic: Start this medication when you get home and take according to the instructions on the bottle. 2. Zofran 4 mg:  This is to treat nausea and vomiting.  You can take this every 6 hours as needed and only if needed. 3. Norco (hydrocodone/acetaminophen) 5/325 mg:  This is only to be used after you have taken the motrin or the tylenol. Every 8 hours as needed. Over  the counter Medication to take: 4. Ibuprofen (Motrin) 600 mg:  Take this every 6 hours.  If you have additional pain then take 500 mg of the tylenol.  Only take the Norco after you have tried these two. 5. Miralax or stool softener of choice: Take this according to the bottle if you take the [place] Call your surgeon's office if any of the following occur:  Fever 101 degrees F or greater  Excessive bleeding or fluid from the incision site.  Pain that increases over time without aid from the medications  Redness, warmth, or pus draining from incision sites  Persistent nausea or inability to take in liquids  Severe misshapen area that underwent the operation.    NO TYLENOL PRODUCTS UNTIL 1:50 PM       Post Anesthesia Home Care Instructions  Activity: Get plenty of rest for the remainder of the day. A responsible individual must stay with you for 24 hours following the procedure.  For the next 24 hours, DO NOT: -Drive a car -Paediatric nurse -Drink alcoholic beverages -Take any medication unless instructed by your physician -Make any legal decisions or sign important papers.  Meals: Start with liquid foods such as gelatin or soup. Progress to regular foods as tolerated. Avoid greasy, spicy, heavy foods. If nausea and/or vomiting occur, drink only clear liquids until the nausea and/or vomiting subsides. Call your physician if vomiting continues.  Special Instructions/Symptoms: Your throat may feel dry or sore from the anesthesia or the breathing tube placed in your throat during surgery. If this causes discomfort, gargle with warm salt water. The discomfort should disappear within 24 hours.  If you had a scopolamine patch placed behind your ear for the management of post- operative nausea and/or vomiting:  1. The medication in the patch is effective for 72 hours, after which it should be removed.  Wrap patch in a tissue and discard in the trash. Wash hands  thoroughly with soap and water. 2. You may remove the patch earlier than 72 hours if you experience unpleasant side effects which may include dry mouth, dizziness or visual disturbances. 3. Avoid touching the patch. Wash your hands with soap and water after contact with the patch.

## 2019-12-17 NOTE — Transfer of Care (Signed)
Immediate Anesthesia Transfer of Care Note  Patient: [employer]  Procedure(s) Performed: REMOVAL OF BILATERAL TISSUE EXPANDERS WITH PLACEMENT OF BILATERAL BREAST IMPLANTS (Bilateral Breast)  Patient Location: PACU  Anesthesia Type:General  Level of Consciousness: awake, alert , oriented and drowsy  Airway & Oxygen Therapy: Patient Spontanous Breathing and Patient connected to face mask oxygen  Post-op Assessment: Report given to RN and Post -op Vital signs reviewed and stable  Post vital signs: Reviewed and stable  Last Vitals:  Vitals Value Taken Time  BP 135/82 12/17/19 1449  Temp    Pulse 92 12/17/19 1451  Resp    SpO2 100 % 12/17/19 1451  Vitals shown include unvalidated device data.  Last Pain:  Vitals:   12/17/19 1242  TempSrc: Oral  PainSc: 4          Complications: No apparent anesthesia complications

## 2019-12-17 NOTE — Anesthesia Postprocedure Evaluation (Signed)
Anesthesia Post Note  Patient: Monica Poole  Procedure(s) Performed: REMOVAL OF BILATERAL TISSUE EXPANDERS WITH PLACEMENT OF BILATERAL BREAST IMPLANTS (Bilateral Breast)     Patient location during evaluation: PACU Anesthesia Type: General Level of consciousness: sedated and patient cooperative Pain management: pain level controlled Vital Signs Assessment: post-procedure vital signs reviewed and stable Respiratory status: spontaneous breathing Cardiovascular status: stable Anesthetic complications: no    Last Vitals:  Vitals:   12/17/19 1615 12/17/19 1630  BP: 118/74   Pulse: 100   Resp: 16 16  Temp:    SpO2: 100%     Last Pain:  Vitals:   12/17/19 1630  TempSrc:   PainSc: 0-No pain                 Nolon Nations

## 2019-12-17 NOTE — Interval H&P Note (Signed)
History and Physical Interval Note:  12/17/2019 12:21 PM  [facility]  has presented today for surgery, with the diagnosis of history of breast cancer; acquired absence of bilateral breasts.  The various methods of treatment have been discussed with the patient and family. After consideration of risks, benefits and other options for treatment, the patient has consented to  Procedure(s) with comments: REMOVAL OF BILATERAL TISSUE EXPANDERS WITH PLACEMENT OF BILATERAL BREAST IMPLANTS (Bilateral) - 2.5 hours as a surgical intervention.  The patient's history has been reviewed, patient examined, no change in status, stable for surgery.  I have reviewed the patient's chart and labs.  Questions were answered to the patient's satisfaction.     Loel Lofty Ivaan Liddy

## 2019-12-18 ENCOUNTER — Encounter: Payer: Self-pay | Admitting: Allergy and Immunology

## 2019-12-18 ENCOUNTER — Telehealth: Payer: Self-pay

## 2019-12-18 ENCOUNTER — Ambulatory Visit (INDEPENDENT_AMBULATORY_CARE_PROVIDER_SITE_OTHER): Payer: Medicare Other | Admitting: Allergy and Immunology

## 2019-12-18 VITALS — BP 138/64 | HR 88 | Resp 12 | Ht 62.0 in | Wt 146.0 lb

## 2019-12-18 DIAGNOSIS — J3089 Other allergic rhinitis: Secondary | ICD-10-CM

## 2019-12-18 DIAGNOSIS — K219 Gastro-esophageal reflux disease without esophagitis: Secondary | ICD-10-CM

## 2019-12-18 DIAGNOSIS — G43909 Migraine, unspecified, not intractable, without status migrainosus: Secondary | ICD-10-CM | POA: Diagnosis not present

## 2019-12-18 DIAGNOSIS — J4550 Severe persistent asthma, uncomplicated: Secondary | ICD-10-CM

## 2019-12-18 MED ORDER — ALBUTEROL SULFATE HFA 108 (90 BASE) MCG/ACT IN AERS: INHALATION_SPRAY | RESPIRATORY_TRACT | 1 refills | Status: AC

## 2019-12-18 MED ORDER — TRELEGY ELLIPTA 200-62.5-25 MCG/INH IN AEPB: 1.0000 | INHALATION_SPRAY | Freq: Every day | RESPIRATORY_TRACT | 5 refills | Status: AC

## 2019-12-18 MED ORDER — IPRATROPIUM BROMIDE 0.06 % NA SOLN: NASAL | 5 refills | Status: AC

## 2019-12-18 NOTE — Telephone Encounter (Signed)
Please refer to [facility] for diagnosis of laryngopharyngeal reflux.
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
## 2019-12-18 NOTE — Addendum Note (Signed)
Addendum  created 12/18/19 1342 by Tawni Millers, CRNA   Charge Capture section accepted

## 2019-12-18 NOTE — Patient Instructions (Addendum)
  1.  Eliminate exposure to mold.  Dehumidification.  HEPA filter.  2.  Treat and prevent inflammation:   A. Trelegy 200 - 1 inhalation 1 time per day  B. OTC Nasacort - 1 spray each nostril 1 time per day  3.  Treat and prevent reflux:   A. Minimize caffeine consumption  B. Pantoprazole 40 mg - 1 tablet 2 times per day  4. Treat and prevent migraine headache:   A. Minimize caffeine consumption  5. If needed:   A. Ipratropium 0.06% - 1-2 sprays each nostril every 6 hours (Drying)  B. Albuterol HFA - 2 inhalations or nebulization every 4-6 hours  6. Evaluation of throat with ENT for LPR / "blob"  7. Review most recent head CT scan / MRI  8. Return to clinic in 4 weeks or earlier if problem.

## 2019-12-19 NOTE — Telephone Encounter (Signed)
Left message for patient to call the office.  Please inform her that she has an appointment scheduled with Dr. Gaylyn Cheers at [facility] for Friday, April 9th at 2:10 pm.  They will call her that morning to complete check-in process. Please remind her to bring insurance cards and picture ID to appointment.

## 2019-12-19 NOTE — Telephone Encounter (Signed)
Patient informed. 

## 2019-12-22 ENCOUNTER — Encounter: Payer: Self-pay | Admitting: Allergy and Immunology

## 2019-12-25 ENCOUNTER — Encounter: Payer: Self-pay | Admitting: Allergy and Immunology

## 2019-12-26 ENCOUNTER — Encounter: Payer: Self-pay | Admitting: Plastic Surgery

## 2019-12-26 ENCOUNTER — Other Ambulatory Visit: Payer: Self-pay

## 2019-12-26 ENCOUNTER — Ambulatory Visit (INDEPENDENT_AMBULATORY_CARE_PROVIDER_SITE_OTHER): Payer: Medicare Other | Admitting: Plastic Surgery

## 2019-12-26 VITALS — BP 120/74 | HR 84 | Temp 96.9°F | Ht 62.0 in | Wt 143.4 lb

## 2019-12-26 DIAGNOSIS — Z9013 Acquired absence of bilateral breasts and nipples: Secondary | ICD-10-CM

## 2019-12-26 NOTE — Progress Notes (Signed)
The patient is here for follow-up after undergoing replacement of her breast expanders with silicone implants.  The incisions are healing nicely.  There is no sign of hematoma or seroma.  There is no redness or sign of infection.  The patient feels much better now that the expanders are out.  Pain is well controlled. She has an appointment to see [place] in 2 weeks and then I had like to see her again in 3 months.  Call with any questions, concerns or changes  Pictures were obtained of the patient and placed in the chart with the patient's or guardian's permission.

## 2020-01-05 NOTE — Progress Notes (Signed)
Patient is a 44 year old female here for follow-up after removal of bilateral tissue expanders and placement of bilateral silicone implants on [id number] with Dr. Marla Roe.  Today she reports she is doing very well.  Incisions are healing well, C/D/I.  No signs of infection, drainage, redness, seroma/hematoma. Lateral aspect of right breast is tender. No firmness or signs of bruising. she is very pleased with her results.  Follow-up in 4 weeks. Call office with any questions/concerns.  The [place] was signed into law in 2016 which includes the topic of electronic health records.  This provides immediate access to information in MyChart.  This includes consultation notes, operative notes, office notes, lab results and pathology reports.  If you have any questions about what you read please let us know at your next visit or call us at the office.  We are right here with you.

## 2020-01-06 ENCOUNTER — Other Ambulatory Visit: Payer: Self-pay

## 2020-01-06 ENCOUNTER — Encounter: Payer: Self-pay | Admitting: Plastic Surgery

## 2020-01-06 ENCOUNTER — Ambulatory Visit (INDEPENDENT_AMBULATORY_CARE_PROVIDER_SITE_OTHER): Payer: Medicare Other | Admitting: Plastic Surgery

## 2020-01-06 VITALS — BP 113/77 | HR 105 | Temp 97.3°F | Ht 62.0 in | Wt 139.8 lb

## 2020-01-06 DIAGNOSIS — Z9889 Other specified postprocedural states: Secondary | ICD-10-CM | POA: Insufficient documentation

## 2020-01-06 DIAGNOSIS — C50919 Malignant neoplasm of unspecified site of unspecified female breast: Secondary | ICD-10-CM

## 2020-01-07 DIAGNOSIS — C50411 Malignant neoplasm of upper-outer quadrant of right female breast: Secondary | ICD-10-CM | POA: Diagnosis not present

## 2020-01-07 DIAGNOSIS — C7951 Secondary malignant neoplasm of bone: Secondary | ICD-10-CM

## 2020-01-08 DIAGNOSIS — R112 Nausea with vomiting, unspecified: Secondary | ICD-10-CM

## 2020-01-09 DIAGNOSIS — R112 Nausea with vomiting, unspecified: Secondary | ICD-10-CM | POA: Diagnosis not present

## 2020-01-15 ENCOUNTER — Ambulatory Visit: Payer: Medicare Other | Admitting: Allergy and Immunology

## 2020-01-26 ENCOUNTER — Encounter: Payer: Self-pay | Admitting: Plastic Surgery

## 2020-01-26 ENCOUNTER — Telehealth: Payer: Self-pay | Admitting: Plastic Surgery

## 2020-01-26 NOTE — Progress Notes (Signed)
Patient is a 44 year old female who underwent removal of bilateral tissue expanders and placement of bilateral silicone implants on [id number] with Dr. Marla Roe. ~ 6 weeks post-op.  She presents today with concern over drainage coming from her right breast incision after a large mastiff dog jumped up on her. Reports drainage is clear yellow in color. Right breast incision is healing well, c/d/i. No signs of infection, redness, seroma/hematoma. No drainage able to be expressed from incision.  Suspect dog pulled the incision causing serous fluid to leak. Cleaned area and applied steri-strip. Instructed patient to call us if she begins running a fever or if area around incision becomes red, painful, warm to the touch, or begins draining puss so we can call in a Abx.   Follow up in 3 weeks.  Call office with any questions/concerns of if condition worsens.   The [place] was signed into law in 2016 which includes the topic of electronic health records.  This provides immediate access to information in MyChart.  This includes consultation notes, operative notes, office notes, lab results and pathology reports.  If you have any questions about what you read please let us know at your next visit or call us at the office.  We are right here with you.

## 2020-01-26 NOTE — Telephone Encounter (Signed)
Spoke with Monica Poole. She will come in tomorrow to see me at 10:40 am.

## 2020-01-26 NOTE — Telephone Encounter (Signed)
Patient called to advise that there is some leaking but she isn't sure if it's a hole or if a couple of sutures have come off due to a dog jumping on her. She wanted to send pictures to the provider so I sent her a text message to set up Mychart to send to her provider.

## 2020-01-27 ENCOUNTER — Ambulatory Visit (INDEPENDENT_AMBULATORY_CARE_PROVIDER_SITE_OTHER): Payer: Medicare Other | Admitting: Plastic Surgery

## 2020-01-27 ENCOUNTER — Encounter: Payer: Self-pay | Admitting: Plastic Surgery

## 2020-01-27 ENCOUNTER — Other Ambulatory Visit: Payer: Self-pay

## 2020-01-27 VITALS — BP 144/94 | HR 81 | Temp 97.7°F | Ht 63.0 in | Wt 143.0 lb

## 2020-01-27 DIAGNOSIS — C50919 Malignant neoplasm of unspecified site of unspecified female breast: Secondary | ICD-10-CM

## 2020-01-27 DIAGNOSIS — Z9889 Other specified postprocedural states: Secondary | ICD-10-CM

## 2020-01-29 ENCOUNTER — Other Ambulatory Visit (INDEPENDENT_AMBULATORY_CARE_PROVIDER_SITE_OTHER): Payer: Medicare Other | Admitting: Plastic Surgery

## 2020-01-29 ENCOUNTER — Encounter: Payer: Self-pay | Admitting: Plastic Surgery

## 2020-01-29 DIAGNOSIS — Z9889 Other specified postprocedural states: Secondary | ICD-10-CM

## 2020-01-29 MED ORDER — DOXYCYCLINE HYCLATE 100 MG PO TABS: 100.0000 mg | ORAL_TABLET | Freq: Two times a day (BID) | ORAL | 0 refills | Status: AC

## 2020-01-29 NOTE — Progress Notes (Signed)
Patient called stating that right breast around incision has gotten red and warm to the touch. Sent doxy x 10 days to pharmacy.  Call office with any questions/concerns or if condition worsens.

## 2020-01-31 ENCOUNTER — Encounter: Payer: Self-pay | Admitting: Surgical

## 2020-01-31 NOTE — H&P (View-Only) (Signed)
Patient is a 44 year old female who called the on-call provider line this afternoon to report that she has noticed increased drainage from her right breast wound. 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 She was seen in the office for this for days ago. She reports that she has noticed increased drainage from her right breast starting last night,  drainage is yellowish clear in color. It does not have a foul odor. She reports that the redness of her right breast has improved. She's currently on doxycycline, which was prescribed for 10 total days. She is on day two or three. She does not have any fevers, chills, nausea, vomiting. She's drinking normally, but reports decreased appetite.   I recommended patient to call if symptoms worsen. I recommend continue to wear sports  bra, can use absorptive dressing to help collect any drainage. It is reassuring that she is on antibiotics's, redness is not worsening but improving, drainage is not foul, drainage is clear, recommend calling with any further questions or concerns. Also recommended to send photos through my chart if she is still worried. I also recommended her to call the office on Monday if she is still concerned to schedule a follow up appointment fo r next week. She knows to call with any further questions or concerns or symptoms worsen.

## 2020-01-31 NOTE — Progress Notes (Signed)
Patient is a 44 year old female who called the on-call provider line this afternoon to report that she has noticed increased drainage from her right breast wound. 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 She was seen in the office for this for days ago. She reports that she has noticed increased drainage from her right breast starting last night,  drainage is yellowish clear in color. It does not have a foul odor. She reports that the redness of her right breast has improved. She's currently on doxycycline, which was prescribed for 10 total days. She is on day two or three. She does not have any fevers, chills, nausea, vomiting. She's drinking normally, but reports decreased appetite.   I recommended patient to call if symptoms worsen. I recommend continue to wear sports  bra, can use absorptive dressing to help collect any drainage. It is reassuring that she is on antibiotics's, redness is not worsening but improving, drainage is not foul, drainage is clear, recommend calling with any further questions or concerns. Also recommended to send photos through my chart if she is still worried. I also recommended her to call the office on Monday if she is still concerned to schedule a follow up appointment fo r next week. She knows to call with any further questions or concerns or symptoms worsen.

## 2020-02-02 ENCOUNTER — Encounter: Payer: Self-pay | Admitting: Plastic Surgery

## 2020-02-02 ENCOUNTER — Telehealth: Payer: Self-pay | Admitting: Surgical

## 2020-02-02 NOTE — Telephone Encounter (Addendum)
Patient is a 44 year old female who called the office this afternoon at 2:55 PM to discuss drainage from her right breast. She is currently on doxycycline. Patient reports that she spoke with her plastic surgeon in [place] about current issue and they reported that she should be evaluated today, patient called the office at ~ 2:55 to speak with clinical staff.  I spoke with patient this afternoon at 3 PM. She reports that her incision opened up last night in the middle of the night and she noticed a lot of drainage.  The drainage is yellowish clear in color.  It does not look like pus.  It does not have a foul odor.   she reports that she is not having any fevers chills nausea or vomiting.  She does not have increased pain.  The incisional opening is approximately 2 inches per the patient.  She does endorse some sweats and feeling weaker today than yesterday.  She does not think that she can see her implant.  She reports that it looks "dark inside".  "I can see inside the hole".  She reports some of the tissue looks brownish.  Redness has not worsened or improved.  She has a history of radiation to this right breast.  She has no other complaints.  She is mostly drinking normally and eating at baseline. She reports that she has a decreased appetite, but this is not new and has been ongoing for multiple months since chemotherapy.  At this time patient is mostly feeling well other than some weakness and sweats.  She lives an hour and a half from the clinic and would be unable to make it today.  Recommend coming early in the morning when we opened, and I will be happy to see her.  I recommended if she developed any worsening symptoms this afternoon, tonight or tomorrow AM or if at any time she  became concerned for any reason then she should be evaluated in the emergency room.  Patient in agreement with current plan.   Addendum: I reviewed photo sent by patient through EMR. It appears she has an incisional  opening ~ 3 cm. Minimal peri-wound erythema, no necrosis noted. No dark tissue noted. This is reassuring and likely due to radiation damaged tissue having poor wound healing.

## 2020-02-03 ENCOUNTER — Other Ambulatory Visit: Payer: Self-pay

## 2020-02-03 ENCOUNTER — Ambulatory Visit (INDEPENDENT_AMBULATORY_CARE_PROVIDER_SITE_OTHER): Payer: Medicare Other | Admitting: Plastic Surgery

## 2020-02-03 ENCOUNTER — Other Ambulatory Visit (HOSPITAL_COMMUNITY)
Admission: RE | Admit: 2020-02-03 | Discharge: 2020-02-03 | Disposition: A | Discharge status: Home / Self Care | Payer: Medicare Other | Source: Ambulatory Visit | Attending: Plastic Surgery | Admitting: Plastic Surgery

## 2020-02-03 ENCOUNTER — Encounter: Payer: Self-pay | Admitting: Plastic Surgery

## 2020-02-03 VITALS — BP 127/78 | HR 74 | Temp 97.3°F | Ht 63.0 in | Wt 143.0 lb

## 2020-02-03 DIAGNOSIS — Z9889 Other specified postprocedural states: Secondary | ICD-10-CM

## 2020-02-03 DIAGNOSIS — Z01812 Encounter for preprocedural laboratory examination: Secondary | ICD-10-CM | POA: Insufficient documentation

## 2020-02-03 DIAGNOSIS — Z20822 Contact with and (suspected) exposure to covid-19: Secondary | ICD-10-CM | POA: Diagnosis not present

## 2020-02-03 LAB — SARS CORONAVIRUS 2 (TAT 6-24 HRS): SARS Coronavirus 2: NEGATIVE

## 2020-02-03 NOTE — Progress Notes (Signed)
   Subjective:    Patient ID: Monica Poole, female    DOB: Jul 11, 1976, 44 y.o.   MRN: [id number]  The patient is a 44 year old female here for follow-up on her breast reconstruction.  She noticed a little bit of drainage from the right breast.  On exam it has split open.  I am not fully surprised due to how thin the skin was and her history of radiation.  It does not appear infected I do not see any purulence.  There is no cellulitis of the right breast tissue.  The left breast looks perfectly fine.     Review of Systems  Constitutional: Positive for activity change. Negative for appetite change.  Eyes: Negative.   Respiratory: Negative.   Gastrointestinal: Negative.   Genitourinary: Negative.        Objective:   Physical Exam Vitals and nursing note reviewed.  Constitutional:      Appearance: Normal appearance.  Cardiovascular:     Rate and Rhythm: Normal rate.     Pulses: Normal pulses.  Pulmonary:     Effort: Pulmonary effort is normal.  Chest:    Neurological:     General: No focal deficit present.     Mental Status: She is alert and oriented to person, place, and time.  Psychiatric:        Mood and Affect: Mood normal.        Behavior: Behavior normal.       Assessment & Plan:     ICD-10-CM   1. S/P breast reconstruction, bilateral  Z98.890     We discussed the options for right now which include the following: Removal of the implant with placement of a drain.  Removal of the implant and placement of a smaller implant.  The absolute safest is remove the implant and let it heal up and then she can go for her definitive reconstruction with autologous reconstruction.  The patient wants to do what is safest and has decided to remove the implant and not replace it.  She plans to go back to [place] for the reconstruction.  A Diep flap or a TRAM flap would certainly be possible as well as a latissimus flap.  This is likely a good option due to her radiation on that  side.  Pictures were obtained of the patient and placed in the chart with the patient's or guardian's permission.

## 2020-02-04 ENCOUNTER — Ambulatory Visit: Payer: Medicare Other | Admitting: Plastic Surgery

## 2020-02-04 ENCOUNTER — Encounter (HOSPITAL_BASED_OUTPATIENT_CLINIC_OR_DEPARTMENT_OTHER): Payer: Self-pay | Admitting: Plastic Surgery

## 2020-02-04 ENCOUNTER — Other Ambulatory Visit: Payer: Self-pay

## 2020-02-04 ENCOUNTER — Telehealth: Payer: Self-pay | Admitting: Plastic Surgery

## 2020-02-04 NOTE — Telephone Encounter (Signed)
[place] and [facility] office attempting to contact patient regarding surgery. The patient is not answering and her voicemail is full. If patient calls back, please have her contact Jerene Pitch at [facility] [phone] as soon as possible.

## 2020-02-04 NOTE — Progress Notes (Signed)
Notified Shayna with Dr. Marla Roe that patient has not answered or returned pre op phone call for surgery tomorrow 02/05/20.

## 2020-02-05 ENCOUNTER — Encounter (HOSPITAL_BASED_OUTPATIENT_CLINIC_OR_DEPARTMENT_OTHER)
Admission: RE | Disposition: A | Discharge status: Home / Self Care | Payer: Self-pay | Source: Home / Self Care | Attending: Plastic Surgery

## 2020-02-05 ENCOUNTER — Ambulatory Visit (HOSPITAL_BASED_OUTPATIENT_CLINIC_OR_DEPARTMENT_OTHER): Payer: Medicare Other | Admitting: Anesthesiology

## 2020-02-05 ENCOUNTER — Ambulatory Visit (HOSPITAL_BASED_OUTPATIENT_CLINIC_OR_DEPARTMENT_OTHER)
Admission: RE | Admit: 2020-02-05 | Discharge: 2020-02-05 | Disposition: A | Discharge status: Home / Self Care | Payer: Medicare Other | Attending: Plastic Surgery | Admitting: Plastic Surgery

## 2020-02-05 ENCOUNTER — Encounter (HOSPITAL_BASED_OUTPATIENT_CLINIC_OR_DEPARTMENT_OTHER): Payer: Self-pay | Admitting: Plastic Surgery

## 2020-02-05 ENCOUNTER — Other Ambulatory Visit: Payer: Self-pay

## 2020-02-05 DIAGNOSIS — T8549XA Other mechanical complication of breast prosthesis and implant, initial encounter: Secondary | ICD-10-CM | POA: Diagnosis present

## 2020-02-05 DIAGNOSIS — C50911 Malignant neoplasm of unspecified site of right female breast: Secondary | ICD-10-CM

## 2020-02-05 DIAGNOSIS — X58XXXA Exposure to other specified factors, initial encounter: Secondary | ICD-10-CM | POA: Insufficient documentation

## 2020-02-05 DIAGNOSIS — C50919 Malignant neoplasm of unspecified site of unspecified female breast: Secondary | ICD-10-CM

## 2020-02-05 DIAGNOSIS — Z853 Personal history of malignant neoplasm of breast: Secondary | ICD-10-CM | POA: Diagnosis not present

## 2020-02-05 HISTORY — DX: Gastro-esophageal reflux disease without esophagitis: K21.9

## 2020-02-05 HISTORY — PX: BREAST IMPLANT REMOVAL: SHX5361

## 2020-02-05 SURGERY — REMOVAL, IMPLANT, BREAST
Anesthesia: General | Site: Breast | Laterality: Right

## 2020-02-05 MED ORDER — ONDANSETRON HCL 4 MG/2ML IJ SOLN
INTRAMUSCULAR | Status: DC | PRN
  Administered 2020-02-05: 4 mg via INTRAVENOUS

## 2020-02-05 MED ORDER — 0.9 % SODIUM CHLORIDE (POUR BTL) OPTIME
TOPICAL | Status: DC | PRN
  Administered 2020-02-05: 1000 mL

## 2020-02-05 MED ORDER — SODIUM CHLORIDE 0.9 % IV SOLN
INTRAVENOUS | Status: DC | PRN
  Administered 2020-02-05: 500 mL

## 2020-02-05 MED ORDER — OXYCODONE HCL 5 MG PO TABS
10.0000 mg | ORAL_TABLET | Freq: Once | ORAL | Status: AC | PRN
  Administered 2020-02-05: 10 mg via ORAL

## 2020-02-05 MED ORDER — KETAMINE HCL 100 MG/ML IJ SOLN
INTRAMUSCULAR | Status: DC | PRN
  Administered 2020-02-05: 30 mg via INTRAVENOUS

## 2020-02-05 MED ORDER — SODIUM CHLORIDE 0.9% FLUSH: 3.0000 mL | INTRAVENOUS | Status: DC | PRN

## 2020-02-05 MED ORDER — CEFAZOLIN SODIUM-DEXTROSE 2-4 GM/100ML-% IV SOLN
INTRAVENOUS | Status: AC
  Filled 2020-02-05: qty 100

## 2020-02-05 MED ORDER — FENTANYL CITRATE (PF) 100 MCG/2ML IJ SOLN
INTRAMUSCULAR | Status: AC
  Filled 2020-02-05: qty 2

## 2020-02-05 MED ORDER — OXYCODONE HCL 5 MG PO TABS: 5.0000 mg | ORAL_TABLET | ORAL | Status: DC | PRN

## 2020-02-05 MED ORDER — FENTANYL CITRATE (PF) 100 MCG/2ML IJ SOLN
INTRAMUSCULAR | Status: DC | PRN
  Administered 2020-02-05 (×4): 50 ug via INTRAVENOUS

## 2020-02-05 MED ORDER — MIDAZOLAM HCL 2 MG/2ML IJ SOLN
INTRAMUSCULAR | Status: AC
  Filled 2020-02-05: qty 2

## 2020-02-05 MED ORDER — ONDANSETRON HCL 4 MG/2ML IJ SOLN: 4.0000 mg | Freq: Once | INTRAMUSCULAR | Status: DC | PRN

## 2020-02-05 MED ORDER — PROPOFOL 500 MG/50ML IV EMUL
INTRAVENOUS | Status: AC
  Filled 2020-02-05: qty 50

## 2020-02-05 MED ORDER — ACETAMINOPHEN 325 MG PO TABS: 650.0000 mg | ORAL_TABLET | ORAL | Status: DC | PRN

## 2020-02-05 MED ORDER — SODIUM CHLORIDE 0.9% FLUSH: 3.0000 mL | Freq: Two times a day (BID) | INTRAVENOUS | Status: DC

## 2020-02-05 MED ORDER — MIDAZOLAM HCL 2 MG/2ML IJ SOLN
INTRAMUSCULAR | Status: DC | PRN
  Administered 2020-02-05: 2 mg via INTRAVENOUS

## 2020-02-05 MED ORDER — DEXAMETHASONE SODIUM PHOSPHATE 10 MG/ML IJ SOLN
INTRAMUSCULAR | Status: DC | PRN
  Administered 2020-02-05: 5 mg via INTRAVENOUS

## 2020-02-05 MED ORDER — LIDOCAINE-EPINEPHRINE 1 %-1:100000 IJ SOLN
INTRAMUSCULAR | Status: DC | PRN
  Administered 2020-02-05: 2.5 mL

## 2020-02-05 MED ORDER — MORPHINE SULFATE (PF) 4 MG/ML IV SOLN: 2.0000 mg | INTRAVENOUS | Status: DC | PRN

## 2020-02-05 MED ORDER — SODIUM CHLORIDE 0.9 % IV SOLN: 250.0000 mL | INTRAVENOUS | Status: DC | PRN

## 2020-02-05 MED ORDER — ACETAMINOPHEN 650 MG RE SUPP: 650.0000 mg | RECTAL | Status: DC | PRN

## 2020-02-05 MED ORDER — LIDOCAINE 2% (20 MG/ML) 5 ML SYRINGE
INTRAMUSCULAR | Status: AC
  Filled 2020-02-05: qty 5

## 2020-02-05 MED ORDER — LACTATED RINGERS IV SOLN: INTRAVENOUS | Status: DC

## 2020-02-05 MED ORDER — CHLORHEXIDINE GLUCONATE CLOTH 2 % EX PADS: 6.0000 | MEDICATED_PAD | Freq: Once | CUTANEOUS | Status: DC

## 2020-02-05 MED ORDER — FENTANYL CITRATE (PF) 100 MCG/2ML IJ SOLN
25.0000 ug | INTRAMUSCULAR | Status: DC | PRN
  Administered 2020-02-05 (×2): 25 ug via INTRAVENOUS

## 2020-02-05 MED ORDER — FENTANYL CITRATE (PF) 100 MCG/2ML IJ SOLN: 50.0000 ug | INTRAMUSCULAR | Status: DC | PRN

## 2020-02-05 MED ORDER — KETAMINE HCL 100 MG/ML IJ SOLN
INTRAMUSCULAR | Status: AC
  Filled 2020-02-05: qty 1

## 2020-02-05 MED ORDER — OXYCODONE HCL 5 MG/5ML PO SOLN: 5.0000 mg | Freq: Once | ORAL | Status: AC | PRN

## 2020-02-05 MED ORDER — LIDOCAINE HCL (CARDIAC) PF 100 MG/5ML IV SOSY
PREFILLED_SYRINGE | INTRAVENOUS | Status: DC | PRN
  Administered 2020-02-05: 60 mg via INTRAVENOUS

## 2020-02-05 MED ORDER — CEFAZOLIN SODIUM-DEXTROSE 2-4 GM/100ML-% IV SOLN
2.0000 g | INTRAVENOUS | Status: AC
  Administered 2020-02-05: 08:00:00 2 g via INTRAVENOUS

## 2020-02-05 MED ORDER — OXYCODONE HCL 5 MG PO TABS
ORAL_TABLET | ORAL | Status: AC
  Filled 2020-02-05: qty 2

## 2020-02-05 MED ORDER — SODIUM CHLORIDE 0.9 % IV SOLN
INTRAVENOUS | Status: AC
  Filled 2020-02-05: qty 500000

## 2020-02-05 MED ORDER — PROPOFOL 10 MG/ML IV BOLUS
INTRAVENOUS | Status: DC | PRN
  Administered 2020-02-05: 200 mg via INTRAVENOUS

## 2020-02-05 MED ORDER — MIDAZOLAM HCL 2 MG/2ML IJ SOLN: 1.0000 mg | INTRAMUSCULAR | Status: DC | PRN

## 2020-02-05 SURGICAL SUPPLY — 65 items
BAG DECANTER FOR FLEXI CONT (MISCELLANEOUS) ×2 IMPLANT
BINDER BREAST LRG (GAUZE/BANDAGES/DRESSINGS) ×2 IMPLANT
BINDER BREAST MEDIUM (GAUZE/BANDAGES/DRESSINGS) IMPLANT
BINDER BREAST XLRG (GAUZE/BANDAGES/DRESSINGS) IMPLANT
BINDER BREAST XXLRG (GAUZE/BANDAGES/DRESSINGS) IMPLANT
BIOPATCH RED 1 DISK 7.0 (GAUZE/BANDAGES/DRESSINGS) IMPLANT
BLADE HEX COATED 2.75 (ELECTRODE) ×2 IMPLANT
BLADE SURG 15 STRL LF DISP TIS (BLADE) ×2 IMPLANT
BLADE SURG 15 STRL SS (BLADE) ×4
BNDG GAUZE ELAST 4 BULKY (GAUZE/BANDAGES/DRESSINGS) IMPLANT
CANISTER SUCT 1200ML W/VALVE (MISCELLANEOUS) ×2 IMPLANT
COVER BACK TABLE 60X90IN (DRAPES) ×2 IMPLANT
COVER MAYO STAND STRL (DRAPES) ×2 IMPLANT
COVER WAND RF STERILE (DRAPES) IMPLANT
DECANTER SPIKE VIAL GLASS SM (MISCELLANEOUS) IMPLANT
DERMABOND ADVANCED (GAUZE/BANDAGES/DRESSINGS) ×1
DERMABOND ADVANCED .7 DNX12 (GAUZE/BANDAGES/DRESSINGS) ×1 IMPLANT
DRAIN CHANNEL 19F RND (DRAIN) IMPLANT
DRAPE LAPAROSCOPIC ABDOMINAL (DRAPES) ×2 IMPLANT
DRSG MEPILEX BORDER 4X8 (GAUZE/BANDAGES/DRESSINGS) ×2 IMPLANT
DRSG PAD ABDOMINAL 8X10 ST (GAUZE/BANDAGES/DRESSINGS) ×2 IMPLANT
ELECT BLADE 4.0 EZ CLEAN MEGAD (MISCELLANEOUS) ×2
ELECT BLADE 6.5 EXT (BLADE) IMPLANT
ELECT REM PT RETURN 9FT ADLT (ELECTROSURGICAL) ×2
ELECTRODE BLDE 4.0 EZ CLN MEGD (MISCELLANEOUS) ×1 IMPLANT
ELECTRODE REM PT RTRN 9FT ADLT (ELECTROSURGICAL) ×1 IMPLANT
EVACUATOR SILICONE 100CC (DRAIN) ×2 IMPLANT
GLOVE BIO SURGEON STRL SZ 6.5 (GLOVE) IMPLANT
GOWN STRL REUS W/ TWL LRG LVL3 (GOWN DISPOSABLE) ×3 IMPLANT
GOWN STRL REUS W/TWL LRG LVL3 (GOWN DISPOSABLE) ×6
IV NS 1000ML (IV SOLUTION)
IV NS 1000ML BAXH (IV SOLUTION) IMPLANT
IV NS 500ML (IV SOLUTION)
IV NS 500ML BAXH (IV SOLUTION) IMPLANT
KIT FILL SYSTEM UNIVERSAL (SET/KITS/TRAYS/PACK) IMPLANT
NDL SAFETY ECLIPSE 18X1.5 (NEEDLE) ×1 IMPLANT
NEEDLE HYPO 18GX1.5 SHARP (NEEDLE) ×2
NEEDLE HYPO 25X1 1.5 SAFETY (NEEDLE) ×2 IMPLANT
NS IRRIG 1000ML POUR BTL (IV SOLUTION) ×2 IMPLANT
PACK BASIN DAY SURGERY FS (CUSTOM PROCEDURE TRAY) ×2 IMPLANT
PENCIL SMOKE EVACUATOR (MISCELLANEOUS) ×2 IMPLANT
PIN SAFETY STERILE (MISCELLANEOUS) ×2 IMPLANT
SLEEVE SCD COMPRESS KNEE MED (MISCELLANEOUS) ×2 IMPLANT
SPONGE LAP 18X18 RF (DISPOSABLE) ×4 IMPLANT
STRIP SUTURE WOUND CLOSURE 1/2 (MISCELLANEOUS) ×2 IMPLANT
SUT MNCRL AB 4-0 PS2 18 (SUTURE) IMPLANT
SUT MON AB 3-0 SH 27 (SUTURE) ×4
SUT MON AB 3-0 SH27 (SUTURE) ×2 IMPLANT
SUT MON AB 5-0 PS2 18 (SUTURE) ×4 IMPLANT
SUT PDS AB 2-0 CT2 27 (SUTURE) IMPLANT
SUT PROLENE 3 0 PS 2 (SUTURE) IMPLANT
SUT SILK 3 0 PS 1 (SUTURE) IMPLANT
SUT VIC AB 3-0 SH 27 (SUTURE) ×4
SUT VIC AB 3-0 SH 27X BRD (SUTURE) ×2 IMPLANT
SUT VICRYL 4-0 PS2 18IN ABS (SUTURE) IMPLANT
SWAB COLLECTION DEVICE MRSA (MISCELLANEOUS) IMPLANT
SWAB CULTURE ESWAB REG 1ML (MISCELLANEOUS) IMPLANT
SYR 50ML LL SCALE MARK (SYRINGE) IMPLANT
SYR BULB IRRIGATION 50ML (SYRINGE) ×2 IMPLANT
SYR CONTROL 10ML LL (SYRINGE) ×2 IMPLANT
TOWEL GREEN STERILE FF (TOWEL DISPOSABLE) ×4 IMPLANT
TRAY DSU PREP LF (CUSTOM PROCEDURE TRAY) ×2 IMPLANT
TUBE CONNECTING 20X1/4 (TUBING) ×2 IMPLANT
UNDERPAD 30X36 HEAVY ABSORB (UNDERPADS AND DIAPERS) ×2 IMPLANT
YANKAUER SUCT BULB TIP NO VENT (SUCTIONS) ×2 IMPLANT

## 2020-02-05 NOTE — Transfer of Care (Signed)
Immediate Anesthesia Transfer of Care Note  Patient: [employer]  Procedure(s) Performed: REMOVAL BREAST IMPLANT WITH CLOSURE (Right Breast)  Patient Location: PACU  Anesthesia Type:General  Level of Consciousness: drowsy and patient cooperative  Airway & Oxygen Therapy: Patient Spontanous Breathing and Patient connected to face mask oxygen  Post-op Assessment: Report given to RN and Post -op Vital signs reviewed and stable  Post vital signs: Reviewed and stable  Last Vitals:  Vitals Value Taken Time  BP 158/96 02/05/20 0920  Temp    Pulse 77 02/05/20 0922  Resp 13 02/05/20 0922  SpO2 100 % 02/05/20 0922  Vitals shown include unvalidated device data.  Last Pain:  Vitals:   02/05/20 0803  TempSrc: Oral  PainSc: 4       Patients Stated Pain Goal: 5 ([id number])  Complications: No apparent anesthesia complications

## 2020-02-05 NOTE — Anesthesia Preprocedure Evaluation (Addendum)
Anesthesia Evaluation  Patient identified by MRN, date of birth, ID band Patient awake    Reviewed: Allergy & Precautions, NPO status , Patient's Chart, lab work & pertinent test results  Airway Mallampati: II  TM Distance: >3 FB Neck ROM: Full    Dental no notable dental hx. (+) Teeth Intact, Dental Advisory Given   Pulmonary asthma (severe, on chronic prednisone, no recent exacerbations) ,    Pulmonary exam normal        Cardiovascular negative cardio ROS Normal cardiovascular exam     Neuro/Psych  Headaches, PSYCHIATRIC DISORDERS Anxiety Depression    GI/Hepatic GERD  Medicated,(+)     substance abuse (fentanyl patch, oxycodone 20 mg)  ,   Endo/Other  negative endocrine ROS  Renal/GU negative Renal ROS  negative genitourinary   Musculoskeletal  (+) Arthritis , narcotic dependent  Abdominal   Peds  Hematology negative hematology ROS (+)   Anesthesia Other Findings  H/o right breast cancer s/p reconstruction  Reproductive/Obstetrics                            Anesthesia Physical  Anesthesia Plan  ASA: III  Anesthesia Plan: General   Post-op Pain Management:    Induction: Intravenous  PONV Risk Score and Plan: 3 and Midazolam, Dexamethasone, Ondansetron and Treatment may vary due to age or medical condition  Airway Management Planned: LMA  Additional Equipment: None  Intra-op Plan:   Post-operative Plan: Extubation in OR  Informed Consent: I have reviewed the patients History and Physical, chart, labs and discussed the procedure including the risks, benefits and alternatives for the proposed anesthesia with the patient or authorized representative who has indicated his/her understanding and acceptance.     Dental advisory given  Plan Discussed with:   Anesthesia Plan Comments:         Anesthesia Quick Evaluation

## 2020-02-05 NOTE — Anesthesia Procedure Notes (Signed)
Procedure Name: LMA Insertion Date/Time: 02/05/2020 8:19 AM Performed by: Raenette Rover, CRNA Pre-anesthesia Checklist: Patient identified, Emergency Drugs available, Suction available and Patient being monitored Patient Re-evaluated:Patient Re-evaluated prior to induction Oxygen Delivery Method: Circle system utilized Preoxygenation: Pre-oxygenation with 100% oxygen Induction Type: IV induction LMA: LMA inserted LMA Size: 4.0 Number of attempts: 1 Placement Confirmation: positive ETCO2 and breath sounds checked- equal and bilateral Tube secured with: Tape Dental Injury: Teeth and Oropharynx as per pre-operative assessment

## 2020-02-05 NOTE — Discharge Instructions (Addendum)
May take Oxycodone again at 4:45PM.  INSTRUCTIONS FOR AFTER SURGERY   You will likely have some questions about what to expect following your operation.  The following information will help you and your family understand what to expect when you are discharged from the hospital.  Following these guidelines will help ensure a smooth recovery and reduce risks of complications.  Postoperative instructions include information on: diet, wound care, medications and physical activity.  AFTER SURGERY Expect to go home after the procedure.  In some cases, you may need to spend one night in the hospital for observation.  DIET This surgery does not require a specific diet.  However, I have to mention that the healthier you eat the better your body can start healing. It is important to increasing your protein intake.  This means limiting the foods with added sugar.  Focus on fruits and vegetables and some meat.  If you have any liposuction during your procedure be sure to drink water.  If your urine is bright yellow, then it is concentrated, and you need to drink more water.  As a general rule after surgery, you should have 8 ounces of water every hour while awake.  If you find you are persistently nauseated or unable to take in liquids let us know.  NO TOBACCO USE or EXPOSURE.  This will slow your healing process and increase the risk of a wound.  WOUND CARE If you have a drain: Clean with baby wipes until the drain is removed.   If you have steri-strips / tape directly attached to your skin leave them in place. It is OK to get these wet.  No baths, pools or hot tubs for two weeks. We close your incision to leave the smallest and best-looking scar. No ointment or creams on your incisions until given the go ahead.  Especially not Neosporin (Too many skin reactions with this one).  A few weeks after surgery you can use Mederma and start massaging the scar. We ask you to wear your binder or sports bra for the first  6 weeks around the clock, including while sleeping. This provides added comfort and helps reduce the fluid accumulation at the surgery site.  ACTIVITY No heavy lifting until cleared by the doctor.  It is OK to walk and climb stairs. In fact, moving your legs is very important to decrease your risk of a blood clot.  It will also help keep you from getting deconditioned.  Every 1 to 2 hours get up and walk for 5 minutes. This will help with a quicker recovery back to normal.  Let pain be your guide so you don't do too much.  NO, you cannot do the spring cleaning and don't plan on taking care of anyone else.  This is your time for TLC.   WORK Everyone returns to work at different times. As a rough guide, most people take at least 1 - 2 weeks off prior to returning to work. If you need documentation for your job, bring the forms to your postoperative follow up visit.  DRIVING Arrange for someone to bring you home from the hospital.  You may be able to drive a few days after surgery but not while taking any narcotics or valium.  BOWEL MOVEMENTS Constipation can occur after anesthesia and while taking pain medication.  It is important to stay ahead for your comfort.  We recommend taking Milk of Magnesia (2 tablespoons; twice a day) while taking the pain pills.  SEROMA This is fluid your body tried to put in the surgical site.  This is normal but if it creates excessive pain and swelling let us know.  It usually decreases in a few weeks.  MEDICATIONS and PAIN CONTROL At your preoperative visit for you history and physical you were given the following medications: 1. An antibiotic: Start this medication when you get home and take according to the instructions on the bottle. 2. Zofran 4 mg:  This is to treat nausea and vomiting.  You can take this every 6 hours as needed and only if needed. 3. Norco (hydrocodone/acetaminophen) 5/325 mg:  This is only to be used after you have taken the motrin or the  tylenol. Every 8 hours as needed. Over the counter Medication to take: 4. Ibuprofen (Motrin) 600 mg:  Take this every 6 hours.  If you have additional pain then take 500 mg of the tylenol.  Only take the Norco after you have tried these two. 5. Miralax or stool softener of choice: Take this according to the bottle if you take the [place] Call your surgeon's office if any of the following occur: . Fever 101 degrees F or greater . Excessive bleeding or fluid from the incision site. . Pain that increases over time without aid from the medications . Redness, warmth, or pus draining from incision sites . Persistent nausea or inability to take in liquids . Severe misshapen area that underwent the operation.  [facility] Plastic Surgery Specialist  What is the benefit of having a drain?  During surgery your tissue layers are separated.  This raw surface stimulates your body to fill the space with serous fluid.  This is normal but you don't want that fluid to collect and prevent healing.  A fluid collection can also become infected.  The Jackson-Pratt (JP) drain is used to eliminate this collection of fluid and allow the tissue to heal together.    Jackson-Pratt (JP) bulb    How to care for your drainage and suction unit at home Your drainage catheter will be connected to a collection device. The vacuum caused when the device is compressed allows drainage to collect in the device.    Wendee Copp your hands with soap and water before and after touching the system. . Empty the JP drain every 12 hours once you get home from your procedure. . Record the fluid amount on the record sheet included. . Start with stripping the drain tube to push the clots or excess fluid to the bulb.  Do this by pinching the tube with one hand near your skin.  Then with the other hand squeeze the tubing and work it toward the bulb.  This should be done several times a day.  This may collapse the tube which will correct  on its own.   . Use a safety pin to attach your collection device to your clothing so there is no tension on the insertion site.   . If you have drainage at the skin insertion site, you can apply a gauze dressing and secure it with tape. . If the drain falls out, apply a gauze dressing over the drain insertion site and secure with tape.   To empty the collection device:   . Release the stopper on the top of the collection unit (bulb).  Signa Kell contents into a measuring container such as a plastic medicine cup.  . Record the day and amount of drainage on the attached  sheet. . This should be done at least twice a day.    To compress the Jackson-Pratt Bulb:  . Release the stopper at the top of the bulb. Marland Kitchen Squeeze the bulb tightly in your fist, squeezing air out of the bulb.  . Replace the stopper while the bulb is compressed.  . Be careful not to spill the contents when squeezing the bulb. . The drainage will start bright red and turn to pink and then yellow with time. . IMPORTANT: If the bulb is not squeezed before adding the stopper it will not draw out the fluid.  Care for the JP drain site and your skin daily:  . You may shower three days after surgery. . Secure the drain to a ribbon or cloth around your waist while showering so it does not pull out while showering. . Be sure your hands are cleaned with soap and water. . Use a clean wet cotton swab to clean the skin around the drain site.  . Use another cotton swab to place Vaseline or antibiotic ointment on the skin around the drain.     Contact your physician if any of the following occur:  Marland Kitchen The fluid in the bulb becomes cloudy. . Your temperature is greater than 101.4.  Marland Kitchen The incision opens. . If you have drainage at the skin insertion site, you can apply a gauze dressing and secure it with tape. . If the drain falls out, apply a gauze dressing over the drain insertion site and secure with tape.  . You will usually have more  drainage when you are active than while you rest or are asleep. If the drainage increases significantly or is bloody call the physician                             Bring this record with you to each office visit Date  Drainage Volume  Date   Drainage volume                                                                                                                                                                                            Post Anesthesia Home Care Instructions  Activity: Get plenty of rest for the remainder of the day. A responsible individual must stay with you for 24 hours following the procedure.  For the next 24 hours, DO NOT: -Drive a car -Paediatric nurse -Drink alcoholic beverages -Take any medication unless instructed by your physician -Make any legal decisions or sign important papers.  Meals: Start with liquid foods such as gelatin or soup.  Progress to regular foods as tolerated. Avoid greasy, spicy, heavy foods. If nausea and/or vomiting occur, drink only clear liquids until the nausea and/or vomiting subsides. Call your physician if vomiting continues.  Special Instructions/Symptoms: Your throat may feel dry or sore from the anesthesia or the breathing tube placed in your throat during surgery. If this causes discomfort, gargle with warm salt water. The discomfort should disappear within 24 hours.  If you had a scopolamine patch placed behind your ear for the management of post- operative nausea and/or vomiting:  1. The medication in the patch is effective for 72 hours, after which it should be removed.  Wrap patch in a tissue and discard in the trash. Wash hands thoroughly with soap and water. 2. You may remove the patch earlier than 72 hours if you experience unpleasant side effects which may include dry mouth, dizziness or visual disturbances. 3. Avoid touching the patch. Wash your hands with soap and water after contact with  the patch.

## 2020-02-05 NOTE — Interval H&P Note (Signed)
History and Physical Interval Note:  02/05/2020 7:50 AM  [facility]  has presented today for surgery, with the diagnosis of RIGHT BREAST WOUND;HISTORY OF BREAST CANCER.  The various methods of treatment have been discussed with the patient and family. After consideration of risks, benefits and other options for treatment, the patient has consented to  Procedure(s): REMOVAL BREAST IMPLANT WITH CLOSURE (Right) as a surgical intervention.  The patient's history has been reviewed, patient examined, no change in status, stable for surgery.  I have reviewed the patient's chart and labs.  Questions were answered to the patient's satisfaction.     Monica Poole

## 2020-02-05 NOTE — Anesthesia Postprocedure Evaluation (Signed)
Anesthesia Post Note  Patient: Monica Poole  Procedure(s) Performed: REMOVAL BREAST IMPLANT WITH CLOSURE (Right Breast)     Patient location during evaluation: PACU Anesthesia Type: General Level of consciousness: awake and alert Pain management: pain level controlled Vital Signs Assessment: post-procedure vital signs reviewed and stable Respiratory status: spontaneous breathing, nonlabored ventilation and respiratory function stable Cardiovascular status: blood pressure returned to baseline and stable Postop Assessment: no apparent nausea or vomiting Anesthetic complications: no    Last Vitals:  Vitals:   02/05/20 1023 02/05/20 1039  BP: (!) 154/100 (!) 156/92  Pulse: 76 73  Resp: 16 18  Temp:  36.6 C  SpO2: 98% 99%    Last Pain:  Vitals:   02/05/20 1039  TempSrc: Oral  PainSc: 7                  Lidia Collum

## 2020-02-05 NOTE — Op Note (Addendum)
DATE OF OPERATION: 02/05/2020  LOCATION: [facility]  PREOPERATIVE DIAGNOSIS: exposed right breast implant after radiation  POSTOPERATIVE DIAGNOSIS: Same  PROCEDURE: Removal of right breast implant and capsulectomy  SURGEON: Lyndee Leo Sanger Eliyah Bazzi, DO  ASSISTANT: Roetta Sessions, PA  EBL: 10 cc  CONDITION: Stable  COMPLICATIONS: None  INDICATION: The patient, Monica Poole, is a 44 y.o. female born on October 23, 1976, is here for treatment of an exposed implant after radiation treatment.   PROCEDURE DETAILS:  The patient was seen prior to surgery and marked.  The IV antibiotics were given. The patient was taken to the operating room and given a general anesthetic. A standard time out was performed and all information was confirmed by those in the room. SCDs were placed.   The patient was prepped and draped with betadine.  An ellipse was excised of the skin at the open area to try to get better healing. It was 1 x 5 cm in size of skin and soft tissue.  The implant was intact and removed.  The ADM on th skin side (superficial) was completely excised and half of the ADM on the chest wall.  If it came off easily without disruption or bleeding it was removed.  This was sent to pathology.  A drain was placed and secured to the chest wall with a 3-0 Silk. The deep layer was closed with the 4-0 Monocryl.  It was extremely thin.  The skin was closed with vertical mattress sutures and simple interpreted due to the thinness.  There was no sign of infection. Derma bond was applied.  The patient was allowed to wake up and taken to recovery room in stable condition at the end of the case. The family was notified at the end of the case.   The advanced practice practitioner (APP) assisted throughout the case.  The APP was essential in retraction and counter traction when needed to make the case progress smoothly.  This retraction and assistance made it possible to see the tissue plans for the  procedure.  The assistance was needed for blood control, tissue re-approximation and assisted with closure of the incision site.  The [place] was signed into law in 2016 which includes the topic of electronic health records.  This provides immediate access to information in MyChart.  This includes consultation notes, operative notes, office notes, lab results and pathology reports.  If you have any questions about what you read please let us know at your next visit or call us at the office.  We are right here with you.

## 2020-02-06 ENCOUNTER — Ambulatory Visit: Payer: Medicare Other | Admitting: Plastic Surgery

## 2020-02-06 LAB — SURGICAL PATHOLOGY

## 2020-02-13 ENCOUNTER — Encounter: Payer: Medicare Other | Admitting: Plastic Surgery

## 2020-02-13 ENCOUNTER — Other Ambulatory Visit: Payer: Self-pay

## 2020-02-13 ENCOUNTER — Ambulatory Visit (INDEPENDENT_AMBULATORY_CARE_PROVIDER_SITE_OTHER): Payer: Medicare Other | Admitting: Surgical

## 2020-02-13 ENCOUNTER — Encounter: Payer: Self-pay | Admitting: Surgical

## 2020-02-13 VITALS — BP 136/89 | HR 102 | Temp 97.7°F | Ht 63.0 in | Wt 139.4 lb

## 2020-02-13 DIAGNOSIS — C50919 Malignant neoplasm of unspecified site of unspecified female breast: Secondary | ICD-10-CM

## 2020-02-13 DIAGNOSIS — Z9013 Acquired absence of bilateral breasts and nipples: Secondary | ICD-10-CM

## 2020-02-13 DIAGNOSIS — Z9889 Other specified postprocedural states: Secondary | ICD-10-CM

## 2020-02-13 NOTE — Progress Notes (Signed)
Patient is a 44 year old female here for follow-up after removal of right breast implant with closure on 02/05/2020 by Dr. Marla Roe.  Unfortunately prior to removal of right breast implant patient had dehiscence of her right breast incision causing exposure of the implant.  She has a history of right breast radiation.  Patient reports she is doing well today.  JP drain output on the right has been less than 3 cc over the last 28 hours.  Drainage is serosanguineous in nature.  She denies any fevers, chills, nausea, vomiting.  Patient reports that she spoke with her plastic surgeon in [place] and they are going to plan to do a DIEP flap in approximately 4 months.  On exam right breast incision is C/D/I, Dermabond in place, some periincisional erythema, likely related to radiation damage, no sign of infection.  No fluid collection noted.  No drainage noted.  JP drain in place with serosanguineous output in the bulb (3cc) .  I removed the right JP drain today due to low output.  Recommend following up in 1 week for reevaluation.  Due to patient's history of wound dehiscence and radiation I would like to see her sooner than typical.  Recommend calling with any questions or concerns. Pictures were obtained of the patient and placed in the chart with the patient's or guardian's permission.

## 2020-02-18 ENCOUNTER — Ambulatory Visit: Payer: Medicare Other | Admitting: Plastic Surgery

## 2020-02-20 ENCOUNTER — Encounter: Payer: Self-pay | Admitting: Surgical

## 2020-02-20 ENCOUNTER — Ambulatory Visit (INDEPENDENT_AMBULATORY_CARE_PROVIDER_SITE_OTHER): Payer: Medicare Other | Admitting: Plastic Surgery

## 2020-02-20 ENCOUNTER — Other Ambulatory Visit: Payer: Self-pay

## 2020-02-20 VITALS — BP 143/89 | HR 98 | Temp 97.8°F

## 2020-02-20 DIAGNOSIS — Z9889 Other specified postprocedural states: Secondary | ICD-10-CM

## 2020-02-20 DIAGNOSIS — C50919 Malignant neoplasm of unspecified site of unspecified female breast: Secondary | ICD-10-CM

## 2020-02-20 DIAGNOSIS — Z9886 Personal history of breast implant removal: Secondary | ICD-10-CM

## 2020-02-20 DIAGNOSIS — Z9013 Acquired absence of bilateral breasts and nipples: Secondary | ICD-10-CM

## 2020-02-20 MED ORDER — TRIAMCINOLONE ACETONIDE 0.1 % EX CREA: 1.0000 "application " | TOPICAL_CREAM | Freq: Two times a day (BID) | CUTANEOUS | 0 refills | Status: DC
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
## 2020-02-23 ENCOUNTER — Telehealth: Payer: Self-pay | Admitting: Plastic Surgery

## 2020-02-23 ENCOUNTER — Other Ambulatory Visit: Payer: Self-pay | Admitting: Plastic Surgery

## 2020-02-23 MED ORDER — DOXYCYCLINE HYCLATE 100 MG PO TABS: 100.0000 mg | ORAL_TABLET | Freq: Two times a day (BID) | ORAL | 0 refills | Status: AC

## 2020-02-23 NOTE — Telephone Encounter (Signed)
Spoke with patient.  Sent in Rx for Doxy 5 days. She is continuing to use the steroid cream for the rash. Swelling of legs is bilateral; denies pain, warmth, or redness. Recommend elevating her legs, drinking water, and watching salt/msg intake. If leg swelling does not resolve let us know.  She may also want to check with Oncology to see if this may have any relation to her chemo.

## 2020-02-23 NOTE — Telephone Encounter (Signed)
Patient called to say that she had fluid drawn off on Friday and over the weekend she had fluid build up in her legs/ankles to the point she had no ankles. She posted them in [place]. She wanted to know the result of the fluid that was supposed to be sent off to see what the fluid was. Also, an antibiotic was supposed to be called in for her if she needed it today. Please call patient to advise.

## 2020-02-27 ENCOUNTER — Telehealth: Payer: Self-pay | Admitting: Plastic Surgery

## 2020-02-27 NOTE — Telephone Encounter (Signed)
Received call from patient asking about her next appointment, verified appointment date. Patient reports fluid in her right breast, not as much as before. Redness is resolving and she is keeping it compressed, just wants to make you aware. She has an appointment Thursday 5/6 if she needs to be seen sooner or the fluid is abnormal please contact patient

## 2020-03-02 ENCOUNTER — Ambulatory Visit (INDEPENDENT_AMBULATORY_CARE_PROVIDER_SITE_OTHER): Payer: Medicare Other | Admitting: Surgical

## 2020-03-02 ENCOUNTER — Encounter: Payer: Self-pay | Admitting: Surgical

## 2020-03-02 ENCOUNTER — Other Ambulatory Visit: Payer: Self-pay

## 2020-03-02 VITALS — BP 149/88 | HR 78 | Temp 97.5°F | Ht 63.0 in | Wt 139.4 lb

## 2020-03-02 DIAGNOSIS — Z9013 Acquired absence of bilateral breasts and nipples: Secondary | ICD-10-CM

## 2020-03-02 DIAGNOSIS — Z9889 Other specified postprocedural states: Secondary | ICD-10-CM

## 2020-03-02 DIAGNOSIS — C50919 Malignant neoplasm of unspecified site of unspecified female breast: Secondary | ICD-10-CM

## 2020-03-02 DIAGNOSIS — Z9886 Personal history of breast implant removal: Secondary | ICD-10-CM

## 2020-03-02 NOTE — Progress Notes (Signed)
Patient is a 44 year old female here for follow-up after removal of right breast implant with closure.  She has a history of radiation to the right breast.  Unfortunately she had dehiscence of her right breast incision and exposure of the implant.  Patient decided to remove implant and not replace.  She is here today for a follow-up due to possible seroma formation.  She was last seen on 02/20/2020 and 65 cc of fluid was aspirated from right breast pocket.  Today she is overall doing well, she reports she noticed some additional swelling within her right breast. It is not tender or painful. She reports erythema has improved to her right breast. She had been applying steroid cream to the right periincisional area as well as the right inframammary fold due to a maculopapular rash. 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  She is planning for further reconstruction in [place] in ~ 4 months.  Chaperone present On exam right breast with good color, no erythema noted, dry skin noted, incision is C/D/I.  I do palpate a fluid wave within the right breast pocket.  There is no fluctuance.  There is no odor.  There is no drainage noted.  No sign of any necrosis or wound dehiscence.   I was able to aspirate 40 cc of serosanguineous fluid from right breast pocket using a sterile technique.  Recommend continuing to wear his compression/sports bra and placing ABD pad or sock within bra to provide additional compression.  She may need additional aspirations due to continuous buildup of lymph fluid.  Patient had 22 lymph nodes removed from right axillary region.  She reports that she is scheduled to have reconstruction in [place] in approximately 4 months with plastic surgeon there.  Recommend calling with any questions or concerns.  We have a follow-up scheduled for 03/23/2020.  If she needs to be sooner please call  Pictures were  obtained of the patient and placed in the chart with the patient's or guardian's permission.

## 2020-03-03 DIAGNOSIS — C7951 Secondary malignant neoplasm of bone: Secondary | ICD-10-CM

## 2020-03-03 DIAGNOSIS — C50411 Malignant neoplasm of upper-outer quadrant of right female breast: Secondary | ICD-10-CM

## 2020-03-04 ENCOUNTER — Ambulatory Visit: Payer: Medicare Other | Admitting: Plastic Surgery

## 2020-03-23 ENCOUNTER — Ambulatory Visit: Payer: Medicare Other | Admitting: Plastic Surgery

## 2020-05-27 DIAGNOSIS — C50411 Malignant neoplasm of upper-outer quadrant of right female breast: Secondary | ICD-10-CM

## 2020-06-02 ENCOUNTER — Other Ambulatory Visit: Payer: Self-pay

## 2020-06-02 ENCOUNTER — Ambulatory Visit (INDEPENDENT_AMBULATORY_CARE_PROVIDER_SITE_OTHER): Payer: Medicare Other | Admitting: Surgical

## 2020-06-02 ENCOUNTER — Encounter: Payer: Self-pay | Admitting: Surgical

## 2020-06-02 VITALS — BP 143/86 | HR 107 | Temp 98.6°F

## 2020-06-02 DIAGNOSIS — Z9889 Other specified postprocedural states: Secondary | ICD-10-CM

## 2020-06-02 DIAGNOSIS — Z9013 Acquired absence of bilateral breasts and nipples: Secondary | ICD-10-CM | POA: Diagnosis not present

## 2020-06-02 DIAGNOSIS — C50919 Malignant neoplasm of unspecified site of unspecified female breast: Secondary | ICD-10-CM | POA: Diagnosis not present

## 2020-06-02 DIAGNOSIS — Z9886 Personal history of breast implant removal: Secondary | ICD-10-CM

## 2020-06-02 NOTE — Progress Notes (Signed)
Subjective:     Patient ID: Monica Poole, female    DOB: 08/08/1976, 44 y.o.   MRN: [id number]  Chief Complaint  Patient presents with  . Follow-up    HPI: The patient is a 44 y.o. female here for follow-up after removal of right breast implant.  She has a history of radiation to the right breast.  Patient is overall doing well.  She reports that she has been seeing oncology and they have a "full body" scan planned for late September due to a mass/lump noted on the patient's right posterior back near her shoulder blade.  Patient also reports that she was recently bit by a dog and is currently on Keflex.  Patient would like a referral to local plastic surgeon who performs free flap reconstruction.  She reports that she was originally going to go to [place] for reconstruction, but is unable to have surgery in [place] due to needing family members present postoperatively.   Review of Systems  Constitutional: Negative.   Cardiovascular: Negative.   Skin: Negative.      Objective:   Vital Signs BP (!) 143/86 (BP Location: Left Arm, Patient Position: Sitting, Cuff Size: Large)   Pulse (!) 107   Temp 98.6 F (37 C) (Oral)   SpO2 99%  Vital Signs and Nursing Note Reviewed Chaperone present Physical Exam Constitutional:      General: She is not in acute distress.    Appearance: Normal appearance. She is normal weight. She is not ill-appearing.  HENT:     Head: Normocephalic and atraumatic.  Pulmonary:     Effort: Pulmonary effort is normal.  Chest:    Abdominal:     General: Abdomen is flat.  Skin:    General: Skin is warm and dry.     Findings: No erythema.  Neurological:     General: No focal deficit present.     Mental Status: She is alert and oriented to person, place, and time. Mental status is at baseline.  Psychiatric:        Mood and Affect: Mood normal.        Behavior: Behavior normal.       Assessment/Plan:     ICD-10-CM   1. Malignant neoplasm  of female breast, unspecified estrogen receptor status, unspecified laterality, unspecified site of breast ([place])  C50.919   2. S/P breast reconstruction, bilateral  Z98.890   3. Acquired absence of bilateral breasts and nipples  Z90.13   4. History of removal of right breast implant  Z98.86     Patient is doing well in regards of removal of right breast implant.  There is no sign of any infection, seroma, hematoma.  Discussed with patient that we would be happy to refer her to surgeon who performs free flaps as this is something we do not do.  I have discussed this with Dr. Marla Roe.  In regards to the right breast area, I do not feel as if it is anything concerning, but it is reassuring that patient will have a "full body scan" at the end of September.  It is unclear what scanned the patient is having, but she reports that it will be to evaluate her entire body and specifically to evaluate mass noted near her right posterior scapula.  Recommend calling with questions or concerns.  Follow-up as needed.   Pictures were obtained of the patient and placed in the chart with the patient's or guardian's permission.   Carola Rhine Kelia Gibbon, PA-C  06/02/2020, 4:02 PM

## 2020-06-16 ENCOUNTER — Telehealth: Payer: Self-pay

## 2020-06-16 NOTE — Telephone Encounter (Signed)
Patient left voicemail requesting update on our recommendations for flap surgery. She has noticed a lump on her right breast and could not recall if this was addressed at her last visit. She would like a call back to discuss.

## 2020-06-18 NOTE — Telephone Encounter (Signed)
Returned pt's call regarding the following:   (1) She is inquiring about the referral Dr. Marla Roe is having our office arrange for possible "flap/surgery" reconstruction (2) she has recently found a "lump" left breast/llower-lateral aspect- she describes it as "BB" size (3) she is waiting for [facility] to arrange a "body scan"  I consulted with Dr. Marla Roe regarding the above: (1) Dr. Marla Roe will see pt in office to evaluate the new finding/lump in left breast (2) Dr. Marla Roe has entered the referral request for consultation with Dr. Veda Canning with [facility] for the evaluation of reconstruction/flap procedure (3)- Dr. Marla Roe is aware that she is awaiting a "scan" to be scheduled by [facility]  I informed pt that our office will call her on Monday-06/21/20 to schedule a f/u visit with Dr. Marla Roe for the left breast lump I also let her know that we have submitted the referral to [facility] ( Dr. Veda Canning )- they will contact her to schedule this appoint I did inform pt that if we can assist in any other way- please call us, and I also requested that she have the scan/consultation records sent to Dr. Marla Roe for review Pt understands and agrees with plan of care

## 2020-06-22 ENCOUNTER — Ambulatory Visit: Payer: Medicare Other | Admitting: Plastic Surgery

## 2020-06-29 ENCOUNTER — Ambulatory Visit: Payer: Medicare Other | Admitting: Plastic Surgery

## 2020-07-01 DIAGNOSIS — C787 Secondary malignant neoplasm of liver and intrahepatic bile duct: Secondary | ICD-10-CM | POA: Insufficient documentation

## 2020-07-02 ENCOUNTER — Ambulatory Visit: Payer: Medicare Other | Admitting: Plastic Surgery

## 2020-07-05 ENCOUNTER — Encounter: Payer: Self-pay | Admitting: Plastic Surgery

## 2020-07-05 NOTE — Progress Notes (Deleted)
° °  Subjective:    Patient ID: Monica Poole, female    DOB: 25-Apr-1976, 44 y.o.   MRN: [id number]  The patient is a 44 year old white female here for follow-up on her breast reconstruction.  The patient was originally diagnosed with right-sided breast cancer in 2019.  She underwent bilateral mastectomies and reconstruction with expander placement in [place].  She lives in [place] but had a bad experience at the [facility] which is why she went to [place].  Her original plan was to continue the reconstructive process in [place] but is no longer able to make the trip.  When she was seen by [place] she was not sure how much volume was in her expanders.  She was also not sure if the expanders were above or below the pectoralis major muscle.  The plan was for her to have the expanders removed after her right breast radiation and have her abdomen used for the reconstruction.  Over the past year she has lost 60 pounds due to nausea and being unable to eat sufficiently as a side effect of the chemotherapy.  She also had some family issues.  She became the caregiver for her father who passed away about a year ago and then this year her mother passed away as well.  Her husband also left her during this process.  She has 2 adult children.  Her chemotherapy ended in August 2019.  She is 5 feet 2 inches tall and weighs around 135 pounds. In February 2021 the patient requested implants to be placed because she was very uncomfortable with the implants.  Therefore Mentor smooth high profile Xtra gel 380 cc implants were placed.  Likely due to the radiation the right breast incision opened and the implant was removed in April.  At the last visit the patient reported noticing a right back mass.  She is scheduled for a full body scan later this month.  At the last visit she had also reported a dog bite and was on Keflex.  She requested a referral to a plastic surgeon who could evaluate her for a free flap breast  reconstruction.  The left breast implant is still in place and is the Mentor 380 cc smooth high-profile extra gel implant.     Review of Systems     Objective:   Physical Exam     Assessment & Plan:     ICD-10-CM   1. Malignant neoplasm of female breast, unspecified estrogen receptor status, unspecified laterality, unspecified site of breast ([place])  C50.919   2. Acquired absence of bilateral breasts and nipples  Z90.13   3. S/P breast reconstruction, bilateral  Z98.890   4. History of removal of right breast implant  Z98.86    ***   Pictures were obtained of the patient and placed in the chart with the patient's or guardian's permission.

## 2020-07-06 ENCOUNTER — Ambulatory Visit (INDEPENDENT_AMBULATORY_CARE_PROVIDER_SITE_OTHER): Payer: Medicare Other | Admitting: Plastic Surgery

## 2020-07-06 DIAGNOSIS — Z9013 Acquired absence of bilateral breasts and nipples: Secondary | ICD-10-CM

## 2020-07-06 DIAGNOSIS — Z9889 Other specified postprocedural states: Secondary | ICD-10-CM

## 2020-07-06 DIAGNOSIS — Z9886 Personal history of breast implant removal: Secondary | ICD-10-CM

## 2020-07-06 DIAGNOSIS — C50919 Malignant neoplasm of unspecified site of unspecified female breast: Secondary | ICD-10-CM

## 2020-07-13 ENCOUNTER — Ambulatory Visit: Payer: Medicare Other | Admitting: Plastic Surgery

## 2020-07-13 ENCOUNTER — Ambulatory Visit (INDEPENDENT_AMBULATORY_CARE_PROVIDER_SITE_OTHER): Payer: Medicare Other | Admitting: Plastic Surgery

## 2020-07-13 ENCOUNTER — Other Ambulatory Visit: Payer: Self-pay

## 2020-07-13 ENCOUNTER — Encounter: Payer: Self-pay | Admitting: Plastic Surgery

## 2020-07-13 VITALS — BP 128/84 | HR 74 | Temp 98.6°F

## 2020-07-13 DIAGNOSIS — Z9889 Other specified postprocedural states: Secondary | ICD-10-CM

## 2020-07-13 DIAGNOSIS — N6320 Unspecified lump in the left breast, unspecified quadrant: Secondary | ICD-10-CM

## 2020-07-13 DIAGNOSIS — C50919 Malignant neoplasm of unspecified site of unspecified female breast: Secondary | ICD-10-CM

## 2020-07-13 NOTE — Progress Notes (Signed)
Patient presents today with concern of a small pea-sized mass at approximately the 5 o'clock position of the left breast.  She has had bilateral mastectomies with reconstruction.  Right implant was removed after complications.  The left breast mass is easily palpated upon exam and is located in close proximity to the patient's implant.  Due to patient's history further investigation is warranted and a ultrasound guided needle biopsy has been ordered.  Patient is seeing [facility] on October 5 to talk about abdominal flap reconstruction for the right side and possibly both breasts.  Follow-up with virtual visit in 2 months with Dr. Marla Roe Call office with any questions/concerns.  The [place] was signed into law in 2016 which includes the topic of electronic health records.  This provides immediate access to information in MyChart.  This includes consultation notes, operative notes, office notes, lab results and pathology reports.  If you have any questions about what you read please let us know at your next visit or call us at the office.  We are right here with you.

## 2020-07-15 NOTE — Progress Notes (Signed)
Not seen

## 2020-07-18 ENCOUNTER — Encounter: Payer: Self-pay | Admitting: Oncology

## 2020-08-16 ENCOUNTER — Encounter: Payer: Self-pay | Admitting: Pharmacist

## 2020-08-16 DIAGNOSIS — Z170 Estrogen receptor positive status [ER+]: Secondary | ICD-10-CM

## 2020-08-16 DIAGNOSIS — C50411 Malignant neoplasm of upper-outer quadrant of right female breast: Secondary | ICD-10-CM

## 2020-08-16 DIAGNOSIS — C7951 Secondary malignant neoplasm of bone: Secondary | ICD-10-CM | POA: Insufficient documentation

## 2020-08-25 ENCOUNTER — Other Ambulatory Visit: Payer: Self-pay | Admitting: Pharmacist

## 2020-08-25 ENCOUNTER — Other Ambulatory Visit: Payer: Self-pay | Admitting: Hematology and Oncology

## 2020-08-25 DIAGNOSIS — C7951 Secondary malignant neoplasm of bone: Secondary | ICD-10-CM

## 2020-08-25 DIAGNOSIS — C50411 Malignant neoplasm of upper-outer quadrant of right female breast: Secondary | ICD-10-CM

## 2020-08-25 LAB — BASIC METABOLIC PANEL
BUN: 7 (ref 4–21)
CO2: 32 — AB (ref 13–22)
Chloride: 101 (ref 99–108)
Creatinine: 0.8 (ref 0.5–1.1)
Glucose: 96
Potassium: 2.9 — AB (ref 3.4–5.3)
Sodium: 138 (ref 137–147)

## 2020-08-25 LAB — COMPREHENSIVE METABOLIC PANEL
Albumin: 3.7 (ref 3.5–5.0)
Calcium: 8.3 — AB (ref 8.7–10.7)

## 2020-08-25 LAB — CBC AND DIFFERENTIAL
HCT: 39 (ref 36–46)
Hemoglobin: 13.1 (ref 12.0–16.0)
Neutrophils Absolute: 0.85
Platelets: 262 (ref 150–399)
WBC: 1.8

## 2020-08-25 LAB — HEPATIC FUNCTION PANEL
ALT: 24 (ref 7–35)
AST: 37 — AB (ref 13–35)
Alkaline Phosphatase: 70 (ref 25–125)
Bilirubin, Total: 0.4

## 2020-08-25 LAB — CBC: RBC: 4.15 (ref 3.87–5.11)

## 2020-08-26 ENCOUNTER — Inpatient Hospital Stay: Payer: Medicare Other | Attending: Oncology

## 2020-08-26 ENCOUNTER — Other Ambulatory Visit: Payer: Self-pay

## 2020-08-26 ENCOUNTER — Other Ambulatory Visit: Payer: Self-pay | Admitting: Hematology and Oncology

## 2020-08-26 ENCOUNTER — Inpatient Hospital Stay: Payer: Medicare Other

## 2020-08-26 DIAGNOSIS — C787 Secondary malignant neoplasm of liver and intrahepatic bile duct: Secondary | ICD-10-CM | POA: Insufficient documentation

## 2020-08-26 DIAGNOSIS — C50911 Malignant neoplasm of unspecified site of right female breast: Secondary | ICD-10-CM | POA: Insufficient documentation

## 2020-08-26 DIAGNOSIS — Z5111 Encounter for antineoplastic chemotherapy: Secondary | ICD-10-CM | POA: Insufficient documentation

## 2020-08-26 DIAGNOSIS — E876 Hypokalemia: Secondary | ICD-10-CM | POA: Insufficient documentation

## 2020-08-26 DIAGNOSIS — N6320 Unspecified lump in the left breast, unspecified quadrant: Secondary | ICD-10-CM

## 2020-08-26 DIAGNOSIS — C7951 Secondary malignant neoplasm of bone: Secondary | ICD-10-CM | POA: Insufficient documentation

## 2020-08-26 DIAGNOSIS — C7952 Secondary malignant neoplasm of bone marrow: Secondary | ICD-10-CM

## 2020-08-26 MED ORDER — SODIUM CHLORIDE 0.9 % IV SOLN
Freq: Once | INTRAVENOUS | Status: AC
  Filled 2020-08-26: qty 250

## 2020-08-26 MED ORDER — POTASSIUM CHLORIDE 10 MEQ/100ML IV SOLN
INTRAVENOUS | Status: AC
  Filled 2020-08-26: qty 200

## 2020-08-26 MED ORDER — FULVESTRANT 250 MG/5ML IM SOLN
INTRAMUSCULAR | Status: AC
  Filled 2020-08-26: qty 10

## 2020-08-26 MED ORDER — POTASSIUM CHLORIDE 10 MEQ/100ML IV SOLN
10.0000 meq | INTRAVENOUS | Status: AC
  Administered 2020-08-26 (×2): 10 meq via INTRAVENOUS

## 2020-08-26 MED ORDER — FULVESTRANT 250 MG/5ML IM SOLN
500.0000 mg | Freq: Once | INTRAMUSCULAR | Status: AC
  Administered 2020-08-26: 500 mg via INTRAMUSCULAR

## 2020-08-26 NOTE — Progress Notes (Signed)
PT STABLE AT TIME OF DISCHARGE 

## 2020-08-26 NOTE — Patient Instructions (Signed)

## 2020-08-27 NOTE — Progress Notes (Signed)
PT STABLE AT TIME OF DISCHARGE 

## 2020-09-14 ENCOUNTER — Encounter: Payer: Self-pay | Admitting: Plastic Surgery

## 2020-09-14 ENCOUNTER — Telehealth (INDEPENDENT_AMBULATORY_CARE_PROVIDER_SITE_OTHER): Payer: Medicare Other | Admitting: Plastic Surgery

## 2020-09-14 ENCOUNTER — Other Ambulatory Visit: Payer: Self-pay

## 2020-09-14 DIAGNOSIS — Z9889 Other specified postprocedural states: Secondary | ICD-10-CM

## 2020-09-14 DIAGNOSIS — Z9886 Personal history of breast implant removal: Secondary | ICD-10-CM

## 2020-09-14 NOTE — Progress Notes (Signed)
   Subjective:    Patient ID: Monica Poole, female    DOB: 10-09-1976, 44 y.o.   MRN: [id number]  The patient is a 44 years old female joining me by phone for follow-up.  In [place], in 2019 she was diagnosed with right-sided breast cancer.  She had bilateral modified mastectomies with expander placement.  She then had the right breast radiated.  She then started to receive her care in [place] due to feeling weak and the pandemic.  The expanders were prepectoral best I could tell with significant amounts of ADM.  Her plan was to have either a Diep flap or a TRAM flap.  She lost around 60 pounds over that year and therefore felt that a TRAM may not be an option for her.  She also had separation from her husband and the loss of her father.  The expanders were very tight and her skin was thin.  Her chemotherapy ended in August 2019. She is 5 feet 2 inches tall and weighs around 135 pounds.   In February 2021 the patient had removal of the expanders and placement of bilateral implants.  We placed Mentor smooth high-profile extra gel 380 cc implants bilaterally.  In April she had exposure of the right implant most likely due to the tight and thin skin as well as the radiation.  The implant was removed.  She still has the left implant in place.  She has started to have symptoms of pain and a work-up is being done for possible metastasis.      Review of Systems  Constitutional: Negative.   Eyes: Negative.   Cardiovascular: Positive for leg swelling.       Swelling and placed on lasix.  This caused low Potassium.  She has gotten fluids and replacement.  Gastrointestinal: Negative for abdominal pain.  Genitourinary: Negative.        Objective:   Physical Exam      Assessment & Plan:     ICD-10-CM   1. History of removal of right breast implant  Z98.86   2. S/P breast reconstruction, bilateral  Z98.890     I connected with  Monica Poole on 59/16/38 by a phone application and  verified that I am speaking with the correct person using two identifiers. We talked for 10 minutes.  She was at home and I was at work.  She is having more imaging done and waiting for the results.  The PET scan showed some concerning areas in her bone.  She realizes this needs to be worked out and her chemotherapy prior to any additional surgery.  She will let me know once she has a better idea of her treatment plan.   I discussed the limitations of evaluation and management by telemedicine. The patient expressed understanding and agreed to proceed.

## 2020-09-20 NOTE — Progress Notes (Signed)
PT STABLE AT TIME OF DISCHARGE 

## 2020-09-25 ENCOUNTER — Other Ambulatory Visit: Payer: Self-pay | Admitting: Oncology

## 2020-09-27 ENCOUNTER — Other Ambulatory Visit: Payer: Self-pay | Admitting: Oncology

## 2020-09-27 ENCOUNTER — Inpatient Hospital Stay: Payer: Medicare Other | Admitting: Oncology

## 2020-09-27 ENCOUNTER — Telehealth: Payer: Self-pay | Admitting: Oncology

## 2020-09-27 ENCOUNTER — Inpatient Hospital Stay: Payer: Medicare Other

## 2020-09-27 DIAGNOSIS — Z170 Estrogen receptor positive status [ER+]: Secondary | ICD-10-CM

## 2020-09-27 DIAGNOSIS — C50411 Malignant neoplasm of upper-outer quadrant of right female breast: Secondary | ICD-10-CM
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
## 2020-09-29 ENCOUNTER — Inpatient Hospital Stay: Payer: Medicare Other

## 2020-09-29 NOTE — Progress Notes (Signed)
[place]  [address] [place],  [place]  [address] [phone]  Clinic Day:  09/30/2020  Referring physician: Bonnita Nasuti, MD   This document serves as a record of services personally performed by Hosie Poisson, MD. It was created on their behalf by Curry,Lauren E, a trained medical scribe. The creation of this record is based on the scribe's personal observations and the provider's statements to them.   CHIEF COMPLAINT:  CC: Metastatic breast cancer  Current Treatment:  Will plan for Afinitor 10 mg daily   HISTORY OF PRESENT ILLNESS:  Monica Poole is a 44 y.o. female with a history of stage IIB right breast cancer diagnosed in June 2018.  Estrogen and progesterone receptors were positive at 95% and HER2 negative.  Ki67 was 70%.  She was seen at [facility] in July 2018 for consultation, but did not pursue treatment with [place] at that time.  Testing for hereditary breast and ovarian cancer with the [facility] Hereditary Cancer panel test in July 2018 did not reveal any clinically significant mutation or variants of uncertain significance.  She was going to go to [facility] for a second opinion, but did not pursue this, and we did not hear back from her regarding her cancer.  Her port was removed in 2018 due to septic infection.  She went to [facility] in [place] and was placed on neoadjuvant chemotherapy for 8 months, but with limited response.  In March 2019, she underwent bilateral mastectomies, but had complications with her reconstruction.  She had expanders placed, but never had the permanent implants inserted.  She received adjuvant radiation.  She was then placed on letrozole, but only completed 6 months.  She started having pain in her right hip in February 2020.  Hip x-rays in April revealed multifocal sclerotic bony metastases.  She then underwent a biopsy of the left anterior iliac bone in May,  which confirmed metastatic carcinoma favoring breast primary.  She was placed on palbociclib/fulvestrant, but only completed 4 months.  These were stopped due to problems with insurance and transportation to [place].    We began seeing her again in November 2020 to manage her widespread bone metastases.  CT chest, abdomen, and pelvis in November revealed innumerable small sclerotic metastases throughout the axial and proximal appendicular skeleton, increased in size, number and degree of sclerosis, favoring progression of osseous metastatic disease.  No lymphadenopathy or other findings of extraosseous metastatic disease in the chest, abdomen, or pelvis.  She also underwent a nuclear medicine whole body bone scan, which revealed osseous metastases at left iliac bone, thoracic spine, and bilateral proximal femora.  We resumed palbociclib/fulvestrant in November.  She received palliative radiation to the bilateral hips completed in December.  She was referred to plastic surgeon regarding her expanders.  She had palmar erythrodysesthesia and was given information on that diagnosis and how to manage it.  Her pain medications were titrated and she did require higher doses of prednisone temporarily.  CT chest, abdomen and pelvis from May 3rd revealed development of a segment 3 hypoattenuating liver lesion, measuring 1.8 cm, concerning for new metastasis.  No other evidence of soft tissue metastasis within the chest, abdomen or pelvis.  Osseous metastasis are primarily similar.  New and progressive areas of sclerosis involving the T6 and S1 vertebral bodies were seen.  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  We have  steadily tapered her prednisone dose down.  Her calcium dose was increased to 600 mg TID.    Left diagnostic mammogram and left breast ultrasound from October 7th revealed an indeterminate 1.2 cm mass involving the lower outer quadrant of the reconstructed left  breast at the inframammary fold.  This may represent focal post surgical dense fibrosis or fat necrosis, though an oil cyst could not be confirmed on the spot tangential mammogram.  Biopsy was recommended and scheduled, but she missed this appointment.  CT chest, abdomen and pelvis from October 7th revealed interval increase in the size of a hypodense metastatic lesion of the left lobe of the liver measuring 3.2 x 2.4 cm, previously 2.1 x 2.0 cm.  Suspect subtle, new hypodense lesions of the superior left lobe of the liver anddjacent liver dome, and interval increase in multiple sclerotic osseous lesions throughout the vertebral bodies, sternum and pelvis.  Overall constellation of findings were consistent with worsened metastatic disease and so her fulvestrant and palbociclib were stopped.  She was scheduled with her dentist on October 28th for a broken tooth, so we held denosumab.  Her white count had decreased from 16.5 to 1.8 with an [place] of 850 from her palbociclib.  She is now on fentanyl at 150 mcg every 3 days.  She was placed on Lasix to use as needed for edema.  When we met at the end of October we discussed the other options of chemotherapy.  INTERVAL HISTORY:  Monica Poole is here for follow up and states that she just picked up her Fentanyl and Xanax prescription up from October as her pharmacy did not have these prescriptions at the time and did not call to alert her when they came in.  She is out of oxycodone 20 mg Q4H, pantoprazole 40 mg BID and Lasix 20 mg prn so I will refill these today.  She rates her pain as a 6/10 today of her bilateral hips.  She was having significant edema of her feet and ankles last month, up to a 2+.  She has completely tapered off of prednisone.  She is scheduled for Xgeva on December 6th.  Blood counts and chemistries are unremarkable except for mildly abnormal liver function tests.  Her  appetite is good, and she has lost 5 pounds since her last visit.  She denies fever,  chills or other signs of infection.  She denies nausea, vomiting, bowel issues, or abdominal pain.  She denies sore throat, cough, dyspnea, or chest pain.   REVIEW OF SYSTEMS:  Review of Systems  Constitutional: Negative.   HENT:  Negative.   Eyes: Negative.   Respiratory: Negative.   Cardiovascular: Positive for leg swelling (feet and ankles mostly).  Gastrointestinal: Negative.   Endocrine: Negative.   Genitourinary: Negative.    Musculoskeletal: Positive for arthralgias (bilateral hips) and myalgias.  Skin: Negative.   Neurological: Negative.   Hematological: Negative.   Psychiatric/Behavioral: Negative.      VITALS:  Blood pressure 116/71, pulse 82, temperature 98.1 F (36.7 C), temperature source Oral, resp. rate 18, height _0  (1.549 m), weight 145 lb 11.2 oz (66.1 kg), SpO2 97 %.  Wt Readings from Last 3 Encounters:  09/30/20 145 lb 11.2 oz (66.1 kg)  09/17/20 151 lb 4 oz (68.6 kg)  08/27/20 151 lb 4 oz (68.6 kg)    Body mass index is 27.53 kg/m.  Performance status (ECOG): 1 - Symptomatic but completely ambulatory  PHYSICAL EXAM:  Physical Exam Constitutional:  General: She is not in acute distress.    Appearance: Normal appearance. She is normal weight.  HENT:     Head: Normocephalic and atraumatic.  Eyes:     General: No scleral icterus.    Extraocular Movements: Extraocular movements intact.     Conjunctiva/sclera: Conjunctivae normal.     Pupils: Pupils are equal, round, and reactive to light.  Cardiovascular:     Rate and Rhythm: Normal rate and regular rhythm.     Pulses: Normal pulses.     Heart sounds: Normal heart sounds. No murmur heard.  No friction rub. No gallop.   Pulmonary:     Effort: Pulmonary effort is normal. No respiratory distress.     Breath sounds: Wheezing (slight on the left) present.  Chest:     Breasts:        Right: Absent.      Comments: Tiny nodule in the lower outer of the left reconstruction which remains stable.   Right mastectomy has extensive fibrosis but no evidence of recurrence Abdominal:     General: Bowel sounds are normal. There is no distension.     Palpations: Abdomen is soft. There is no mass.     Tenderness: There is no abdominal tenderness.  Musculoskeletal:        General: Normal range of motion.     Cervical back: Normal range of motion and neck supple.     Right lower leg: No edema.     Left lower leg: No edema.  Lymphadenopathy:     Cervical: No cervical adenopathy.  Skin:    General: Skin is warm and dry.  Neurological:     General: No focal deficit present.     Mental Status: She is alert and oriented to person, place, and time. Mental status is at baseline.  Psychiatric:        Mood and Affect: Mood normal.        Behavior: Behavior normal.        Thought Content: Thought content normal.        Judgment: Judgment normal.     LABS:   CBC Latest Ref Rng & Units 08/25/2020 12/15/2019 07/29/2018  WBC - 1.8 3.5 8.6  Hemoglobin 12.0 - 16.0 13.1 13.8 11.8(L)  Hematocrit 36 - 46 39 41.1 37.5  Platelets 150 - 399 262 297 207   CMP Latest Ref Rng & Units 08/25/2020 07/29/2018 12/04/2017  Glucose 70 - 99 mg/dL - 110(H) 161(H)  BUN 4 - _0 Creatinine 0.5 - 1.1 0.8 0.84 0.79  Sodium 137 - 147 138 137 139  Potassium 3.4 - 5.3 2.9(A) 3.4(L) 4.0  Chloride 99 - 108 101 103 111  CO2 13 - 22 32(A) 23 17(L)  Calcium 8.7 - 10.7 8.3(A) 8.8(L) 8.8(L)  Total Protein 6.5 - 8.1 g/dL - 6.5 -  Total Bilirubin 0.3 - 1.2 mg/dL - 1.0 -  Alkaline Phos 25 - 125 70 119 -  AST 13 - 35 37(A) 28 -  ALT 7 - 35 24 22 -     STUDIES:  No results found.   Allergies:  Allergies  Allergen Reactions  . Bee Venom Anaphylaxis  . Dicyclomine Other (See Comments)    CARDIAC ARREST  Other reaction(s): Other (See Comments), Other (See Comments) [place]  . Latex Rash and Other (See Comments)    BLISTERS  Other reaction(s): Other (See Comments) BLISTERS  . Wasp Venom  Protein Anaphylaxis  . Bentyl [  Dicyclomine Hcl] Other (See Comments)    CARDIAC ARREST  . Other Other (See Comments) and Rash    Blisters, also- ONLY PAPER TAPE!!  . Tape Rash and Other (See Comments)    Blisters, also- ONLY PAPER TAPE!!    Current Medications: Current Outpatient Medications  Medication Sig Dispense Refill  . ALPRAZolam (XANAX) 0.5 MG tablet Take 1 tablet by mouth 4 (four) times daily.    . Ipratropium-Albuterol (COMBIVENT RESPIMAT) 20-100 MCG/ACT AERS respimat Inhale 1 puff into the lungs every 6 (six) hours as needed.     Marland Kitchen albuterol (ACCUNEB) 1.25 MG/3ML nebulizer solution Take 1 ampule by nebulization every 4 (four) hours as needed for wheezing.     Marland Kitchen albuterol (VENTOLIN HFA) 108 (90 Base) MCG/ACT inhaler Can inhale two puffs every four to six hours as needed for cough or wheeze. 8 g 1  . budesonide (PULMICORT) 0.5 MG/2ML nebulizer solution Take 0.5 mg by nebulization 2 (two) times daily as needed (wheezing).    . calcium-vitamin D (OSCAL WITH D) 500-200 MG-UNIT tablet Take 1 tablet by mouth 2 (two) times daily.     Marland Kitchen denosumab (XGEVA) 120 MG/1.7ML SOLN injection Inject 120 mg into the skin once.    . fentaNYL (DURAGESIC) 100 MCG/HR 1 patch every 3 (three) days.    . fentaNYL (DURAGESIC) 50 MCG/HR SMARTSIG:50 MCG Topical Every 3 Days    . Fluticasone-Umeclidin-Vilant (TRELEGY ELLIPTA) 200-62.5-25 MCG/INH AEPB Inhale 1 Dose into the lungs daily. Rinse, gargle, and spit after use. (Patient taking differently: Inhale 1 Dose into the lungs daily as needed. Rinse, gargle, and spit after use.) 60 each 5  . fulvestrant (FASLODEX) 250 MG/5ML injection Inject 500 mg into the muscle once. One injection each buttock over 1-2 minutes. Warm prior to use.    . furosemide (LASIX) 20 MG tablet Take 20 mg by mouth.    Marland Kitchen ipratropium (ATROVENT) 0.06 % nasal spray Can use one to two sprays in each nostril every six hours as needed to dry up nose. 15 mL 5  . mometasone-formoterol (DULERA)  200-5 MCG/ACT AERO Inhale two puffs twice daily to prevent cough or wheeze.  Rinse, gargle, and spit after use. (Patient taking differently: Inhale 2 puffs into the lungs 2 (two) times daily as needed (for "flares" and rinse mouth afterwards). ) 1 Inhaler 5  . ondansetron (ZOFRAN) 4 MG tablet Take 1 tablet (4 mg total) by mouth every 8 (eight) hours as needed for nausea or vomiting. 20 tablet 0  . Oxycodone HCl 20 MG TABS Take 1 tablet by mouth every 4 (four) hours as needed.    . Potassium Chloride ER 20 MEQ TBCR Take 1 tablet by mouth 2 (two) times daily.    . prochlorperazine (COMPAZINE) 10 MG tablet Take 10 mg by mouth every 8 (eight) hours as needed for nausea or vomiting.     . triamcinolone cream (KENALOG) 0.1 % Apply 1 application topically as needed (to affected, irritated sites). Prn     . venlafaxine XR (EFFEXOR-XR) 150 MG 24 hr capsule Take 150 mg by mouth daily.    Marland Kitchen venlafaxine XR (EFFEXOR-XR) 75 MG 24 hr capsule Take 75 mg by mouth daily after breakfast.     No current facility-administered medications for this visit.     ASSESSMENT & PLAN:   Assessment:   1. Stage IIB right breast cancer diagnosed in June 2018.  This was treated at the [facility] in [place], [place] with neoadjuvant chemotherapy, and  adjuvant radiation.  She underwent bilateral mastectomies in March 2019.   2. History of cervical cancer at a young age, treated with surgery and radiation.  3. 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  She was placed on palbociclib/fulvestrant, but only completed 4 months of therapy, then discontinued that on her own.  We assumed follow-up and restarted palbociclib/fulvestrant in November 2020, and started her on monthly denosumab, but she has missed some doses.  She received palliative radiation to the bilateral hips completed in December 2020.  Palbociclib was resumed in May 2021, and  then placed on hold this summer due to poor healing of her reconstruction.  We resumed this at the end of June and now have stopped it as of the end of October due to progression of disease.  4. Depression, now on Effexor dose at 225 mg daily with fairly good control of her symptoms.  She still lacks motivation at times.  5. Bilateral breast reconstruction, with implants placed on February 17th.  This was healing well, but had to be removed due to the skin splitting open.  I do feel a nodule in the lateral left reconstruction and a biopsy was scheduled.  In view of these findings, we will hold off for now, and monitor this area.  6. Diagnosis of breast cancer at a very young age.  She did not have clinically significant mutation or variants of uncertain significance on [facility] myRisk done in July 2018.  Testing for PIK3CA was negative.  7. Lymphedema of the right upper extremity.    8. 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   9. Hypodense metastatic liver lesion of the left lobe, and increased, now measuring 3.2 x 2.4 cm.  Suspect subtle, new hypodense lesions were also observed on most recent CT imaging in October.  10. Mild hypocalcemia, resolved.  We will have her continue oral calcium 600 mg three times daily.  Plan: We discussed treatment planning today in view of recent progression of disease.  Options for oral therapy would include hormonal therapy with Aromasin as well as oral chemotherapy with Afinitor 10 mg daily.  Systemic options would include Taxotere, eribulin, Navelbine, gemcitabine or a combination of gemcitabine/Navelbine.  I reviewed the schedule and possible toxicities associated with these therapies.  The patient declines port placement due to previous complications, but is agreeable to a PICC line for systemic treatment.  Therefore we will proceed with Aromasin 25 mg daily and Afinitor 10 mg daily, and she is in agreement.  I will go ahead and send in the prescription  for Aromasin 25 mg daily.  We will schedule her for an education session with Melissa on Monday.  I will send in a prescription for dexamethasone rinse as a preventative as well as Magic Mouthwash.  She will proceed with Delton See on December 6th.  I will send in refills for oxycodone 20 mg Q4H, pantoprazole 40 mg BID, and Lasix 20 mg prn.  We will see her back in 4 weeks with CBC and CMP prior to her next Xgeva.  The patient verbalizes understanding of an agreement to the plan today.  She knows to call the office should any new questions or concerns arise.   I provided 25 minutes of face-to-face time during this this encounter and > 50% was spent counseling as documented under my assessment and plan.    Derwood Kaplan, MD [facility]  [address] [place]  [address] Dept: [phone] Dept Fax: [phone]   I, Rita Ohara, am acting as scribe for Derwood Kaplan, MD  I have reviewed this report as typed by the medical scribe, and it is complete and accurate.

## 2020-09-30 ENCOUNTER — Telehealth: Payer: Self-pay | Admitting: Oncology

## 2020-09-30 ENCOUNTER — Other Ambulatory Visit: Payer: Self-pay | Admitting: Oncology

## 2020-09-30 ENCOUNTER — Other Ambulatory Visit: Payer: Self-pay | Admitting: Hematology and Oncology

## 2020-09-30 ENCOUNTER — Inpatient Hospital Stay (INDEPENDENT_AMBULATORY_CARE_PROVIDER_SITE_OTHER): Payer: Medicare Other | Admitting: Oncology

## 2020-09-30 ENCOUNTER — Other Ambulatory Visit: Payer: Self-pay

## 2020-09-30 ENCOUNTER — Inpatient Hospital Stay: Payer: Medicare Other | Attending: Oncology

## 2020-09-30 VITALS — BP 116/71 | HR 82 | Temp 98.1°F | Resp 18 | Ht 61.0 in | Wt 145.7 lb

## 2020-09-30 DIAGNOSIS — Z170 Estrogen receptor positive status [ER+]: Secondary | ICD-10-CM | POA: Insufficient documentation

## 2020-09-30 DIAGNOSIS — Z8541 Personal history of malignant neoplasm of cervix uteri: Secondary | ICD-10-CM | POA: Diagnosis not present

## 2020-09-30 DIAGNOSIS — F32A Depression, unspecified: Secondary | ICD-10-CM | POA: Diagnosis not present

## 2020-09-30 DIAGNOSIS — C50911 Malignant neoplasm of unspecified site of right female breast: Secondary | ICD-10-CM | POA: Diagnosis not present

## 2020-09-30 DIAGNOSIS — C7951 Secondary malignant neoplasm of bone: Secondary | ICD-10-CM

## 2020-09-30 DIAGNOSIS — Z79899 Other long term (current) drug therapy: Secondary | ICD-10-CM | POA: Diagnosis not present

## 2020-09-30 DIAGNOSIS — C7952 Secondary malignant neoplasm of bone marrow: Secondary | ICD-10-CM

## 2020-09-30 DIAGNOSIS — R609 Edema, unspecified: Secondary | ICD-10-CM | POA: Diagnosis not present

## 2020-09-30 DIAGNOSIS — C787 Secondary malignant neoplasm of liver and intrahepatic bile duct: Secondary | ICD-10-CM

## 2020-09-30 DIAGNOSIS — C50411 Malignant neoplasm of upper-outer quadrant of right female breast: Secondary | ICD-10-CM

## 2020-09-30 LAB — CBC AND DIFFERENTIAL
HCT: 39 (ref 36–46)
Hemoglobin: 13.4 (ref 12.0–16.0)
Neutrophils Absolute: 2.84
Platelets: 239 (ref 150–399)
WBC: 4.9

## 2020-09-30 LAB — BASIC METABOLIC PANEL
BUN: 10 (ref 4–21)
CO2: 27 — AB (ref 13–22)
Chloride: 105 (ref 99–108)
Creatinine: 0.7 (ref 0.5–1.1)
Glucose: 102
Potassium: 3.8 (ref 3.4–5.3)
Sodium: 137 (ref 137–147)

## 2020-09-30 LAB — HEPATIC FUNCTION PANEL
ALT: 36 — AB (ref 7–35)
AST: 49 — AB (ref 13–35)
Alkaline Phosphatase: 67 (ref 25–125)
Bilirubin, Total: 0.4

## 2020-09-30 LAB — COMPREHENSIVE METABOLIC PANEL
Albumin: 3.7 (ref 3.5–5.0)
Calcium: 9.6 (ref 8.7–10.7)

## 2020-09-30 LAB — CBC: RBC: 4.15 (ref 3.87–5.11)

## 2020-09-30 MED ORDER — MAGIC MOUTHWASH W/LIDOCAINE: 5.0000 mL | Freq: Four times a day (QID) | ORAL | 3 refills | Status: AC | PRN

## 2020-09-30 MED ORDER — EXEMESTANE 25 MG PO TABS: 25.0000 mg | ORAL_TABLET | Freq: Every day | ORAL | 5 refills | Status: DC

## 2020-09-30 MED ORDER — PANTOPRAZOLE SODIUM 40 MG PO TBEC: 40.0000 mg | DELAYED_RELEASE_TABLET | Freq: Two times a day (BID) | ORAL | 5 refills | Status: AC

## 2020-09-30 MED ORDER — FUROSEMIDE 20 MG PO TABS: 20.0000 mg | ORAL_TABLET | Freq: Every day | ORAL | 5 refills | Status: DC

## 2020-09-30 NOTE — Telephone Encounter (Signed)
Per 12/2 LOS, scheduled patient's next Lab, Follow Up, Injection Appt's for Dec/Jan - Gave Patient Appt Summary

## 2020-10-01 LAB — CANCER ANTIGEN 27.29: CA 27.29: 726 U/mL — ABNORMAL HIGH (ref 0.0–38.6)

## 2020-10-04 ENCOUNTER — Inpatient Hospital Stay: Payer: Medicare Other

## 2020-10-04 ENCOUNTER — Other Ambulatory Visit: Payer: Self-pay

## 2020-10-04 ENCOUNTER — Inpatient Hospital Stay (INDEPENDENT_AMBULATORY_CARE_PROVIDER_SITE_OTHER): Payer: Medicare Other | Admitting: Hematology and Oncology

## 2020-10-04 VITALS — BP 121/64 | HR 88 | Temp 98.3°F | Resp 18 | Ht 61.0 in | Wt 147.1 lb

## 2020-10-04 DIAGNOSIS — C50411 Malignant neoplasm of upper-outer quadrant of right female breast: Secondary | ICD-10-CM

## 2020-10-04 DIAGNOSIS — C7952 Secondary malignant neoplasm of bone marrow: Secondary | ICD-10-CM

## 2020-10-04 DIAGNOSIS — C7951 Secondary malignant neoplasm of bone: Secondary | ICD-10-CM | POA: Diagnosis not present

## 2020-10-04 DIAGNOSIS — Z170 Estrogen receptor positive status [ER+]: Secondary | ICD-10-CM

## 2020-10-04 MED ORDER — DENOSUMAB 120 MG/1.7ML ~~LOC~~ SOLN
120.0000 mg | Freq: Once | SUBCUTANEOUS | Status: AC
  Administered 2020-10-04: 120 mg via SUBCUTANEOUS

## 2020-10-04 MED ORDER — DENOSUMAB 120 MG/1.7ML ~~LOC~~ SOLN
SUBCUTANEOUS | Status: AC
  Filled 2020-10-04: qty 1.7

## 2020-10-04 NOTE — Patient Instructions (Signed)
Denosumab injection What is this medicine? DENOSUMAB (den oh sue mab) slows bone breakdown. Prolia is used to treat osteoporosis in women after menopause and in men, and in people who are taking corticosteroids for 6 months or more. Xgeva is used to treat a high calcium level due to cancer and to prevent bone fractures and other bone problems caused by multiple myeloma or cancer bone metastases. Xgeva is also used to treat giant cell tumor of the bone. This medicine may be used for other purposes; ask your health care provider or pharmacist if you have questions. COMMON BRAND NAME(S): Prolia, XGEVA What should I tell my health care provider before I take this medicine? They need to know if you have any of these conditions:  dental disease  having surgery or tooth extraction  infection  kidney disease  low levels of calcium or Vitamin D in the blood  malnutrition  on hemodialysis  skin conditions or sensitivity  thyroid or parathyroid disease  an unusual reaction to denosumab, other medicines, foods, dyes, or preservatives  pregnant or trying to get pregnant  breast-feeding How should I use this medicine? This medicine is for injection under the skin. It is given by a health care professional in a hospital or clinic setting. A special MedGuide will be given to you before each treatment. Be sure to read this information carefully each time. For Prolia, talk to your pediatrician regarding the use of this medicine in children. Special care may be needed. For Xgeva, talk to your pediatrician regarding the use of this medicine in children. While this drug may be prescribed for children as young as 13 years for selected conditions, precautions do apply. Overdosage: If you think you have taken too much of this medicine contact a poison control center or emergency room at once. NOTE: This medicine is only for you. Do not share this medicine with others. What if I miss a dose? It is  important not to miss your dose. Call your doctor or health care professional if you are unable to keep an appointment. What may interact with this medicine? Do not take this medicine with any of the following medications:  other medicines containing denosumab This medicine may also interact with the following medications:  medicines that lower your chance of fighting infection  steroid medicines like prednisone or cortisone This list may not describe all possible interactions. Give your health care provider a list of all the medicines, herbs, non-prescription drugs, or dietary supplements you use. Also tell them if you smoke, drink alcohol, or use illegal drugs. Some items may interact with your medicine. What should I watch for while using this medicine? Visit your doctor or health care professional for regular checks on your progress. Your doctor or health care professional may order blood tests and other tests to see how you are doing. Call your doctor or health care professional for advice if you get a fever, chills or sore throat, or other symptoms of a cold or flu. Do not treat yourself. This drug may decrease your body's ability to fight infection. Try to avoid being around people who are sick. You should make sure you get enough calcium and vitamin D while you are taking this medicine, unless your doctor tells you not to. Discuss the foods you eat and the vitamins you take with your health care professional. See your dentist regularly. Brush and floss your teeth as directed. Before you have any dental work done, tell your dentist you are   receiving this medicine. Do not become pregnant while taking this medicine or for 5 months after stopping it. Talk with your doctor or health care professional about your birth control options while taking this medicine. Women should inform their doctor if they wish to become pregnant or think they might be pregnant. There is a potential for serious side  effects to an unborn child. Talk to your health care professional or pharmacist for more information. What side effects may I notice from receiving this medicine? Side effects that you should report to your doctor or health care professional as soon as possible:  allergic reactions like skin rash, itching or hives, swelling of the face, lips, or tongue  bone pain  breathing problems  dizziness  jaw pain, especially after dental work  redness, blistering, peeling of the skin  signs and symptoms of infection like fever or chills; cough; sore throat; pain or trouble passing urine  signs of low calcium like fast heartbeat, muscle cramps or muscle pain; pain, tingling, numbness in the hands or feet; seizures  unusual bleeding or bruising  unusually weak or tired Side effects that usually do not require medical attention (report to your doctor or health care professional if they continue or are bothersome):  constipation  diarrhea  headache  joint pain  loss of appetite  muscle pain  runny nose  tiredness  upset stomach This list may not describe all possible side effects. Call your doctor for medical advice about side effects. You may report side effects to FDA at 1-800-FDA-1088. Where should I keep my medicine? This medicine is only given in a clinic, doctor's office, or other health care setting and will not be stored at home. NOTE: This sheet is a summary. It may not cover all possible information. If you have questions about this medicine, talk to your doctor, pharmacist, or health care provider.  2020 Elsevier/Gold Standard (2018-02-22 16:10:44)

## 2020-10-04 NOTE — Progress Notes (Signed)
The patient is a 44 year old female with ER+ PR+ HER2- breast cancer.  Patient presents to clinic today with her daughter for chemotherapy education and palliative care consult.  We will start exemestane and everolimus.  We will send in prescriptions for prochlorperazine and ondansetron. I will also send in prescriptions for dexamethasone mouth wash and magic mouthwash for mouthsores.  The patient verbalizes understanding of and agreement to the plan as discussed today.  Provided general information including the following: 1.  Date of education: 10-04-2020 2.  Physician name: Dr. Hinton Rao 3.  Diagnosis: ER+, PR+, HER2- breast cancer 4.  Stage: Metastatic 5.  Palliative 6.  Chemotherapy plan including drugs and how often: Exemestane, Everolimus 7.  Start date: pending authorization 8.  Other referrals: No other referrals at this time 9.  The patient is to call our office with any questions or concerns.  Our office number [phone], if after hours or on the weekend, call the same number and wait for the answering service.  There is always an oncologist on call 10.  Medications prescribed: Ondansetron, Prochlorperazine, Dexamethasone mouthwash, Magic Mouthwash 11.  The patient has verbalized understanding of the treatment plan and has no barriers to adherence or understanding.  Obtained signed consent from patient.  Discussed symptoms including 1.  Low blood counts including red blood cells, white blood cells and platelets. 2. Infection including to avoid large crowds, wash hands frequently, and stay away from people who were sick.  If fever develops of 100.4 or higher, call our office. 3.  Mucositis-given instructions on mouth rinse (baking soda and salt mixture).  Keep mouth clean.  Use soft bristle toothbrush.  If mouth sores develop, call our clinic. 4.  Nausea/vomiting-gave prescriptions for ondansetron 4 mg every 4 hours as needed for nausea, may take around the clock if persistent.   Compazine 10 mg every 6 hours, may take around the clock if persistent. 5.  Diarrhea-use over-the-counter Imodium.  Call clinic if not controlled. 6.  Constipation-use senna, 1 to 2 tablets twice a day.  If no BM in 2 to 3 days call the clinic. 7.  Loss of appetite-try to eat small meals every 2-3 hours.  Call clinic if not eating. 8.  Taste changes-zinc 500 mg daily.  If becomes severe call clinic. 9.  Alcoholic beverages. 10.  Drink 2 to 3 quarts of water per day. 11.  Peripheral neuropathy-patient to call if numbness or tingling in hands or feet is persistent    Gave information on the supportive care team and how to contact them regarding services.  Discussed advanced directives.  The patient does not have their advanced directives but will look at the copy provided in their notebook and will call with any questions. [employer] Social worker Advanced directives  Answered questions to patient satisfaction.  Patient is to call with any further questions or concerns.  Time spent on this palliative care/chemotherapy education was 60 minutes with more than 50% spent discussing diagnosis, prognosis and symptom management.  The medication prescribed to the patient will be printed out from [email or web address] This will give the following information: Name of your medication Approved uses Dose and schedule Storage and handling Handling body fluids and waste Drug and food interactions Possible side effects and management Pregnancy, sexual activity, and contraception Obtaining medication

## 2020-10-04 NOTE — Progress Notes (Signed)
PT STABLE AT TIME OF DISCHARGE 

## 2020-10-05 ENCOUNTER — Other Ambulatory Visit: Payer: Self-pay | Admitting: Oncology

## 2020-10-06 ENCOUNTER — Telehealth: Payer: Self-pay | Admitting: Oncology

## 2020-10-12 ENCOUNTER — Other Ambulatory Visit: Payer: Self-pay

## 2020-10-12 DIAGNOSIS — G4700 Insomnia, unspecified: Secondary | ICD-10-CM

## 2020-10-12 MED ORDER — ZOLPIDEM TARTRATE 10 MG PO TABS: 10.0000 mg | ORAL_TABLET | Freq: Every evening | ORAL | 1 refills | Status: DC | PRN

## 2020-10-14 ENCOUNTER — Encounter: Payer: Self-pay | Admitting: Oncology

## 2020-10-14 ENCOUNTER — Other Ambulatory Visit: Payer: Self-pay | Admitting: Oncology

## 2020-10-14 DIAGNOSIS — C7951 Secondary malignant neoplasm of bone: Secondary | ICD-10-CM

## 2020-10-14 MED ORDER — PREDNISONE 20 MG PO TABS: 20.0000 mg | ORAL_TABLET | Freq: Two times a day (BID) | ORAL | 0 refills | Status: AC

## 2020-10-14 NOTE — Progress Notes (Signed)
Patients Afinitor is ready to be scheduled from [facility]. I called to speak with [facility], they said they called patient on 12/14,12/15,12/16 and have not heard back. I reached out to patient and left her a voicemail asking her to call me back. Also let her know her medication was ready to be scheduled and [facility] phone number. I will try to speak with her later today or tomorrow.

## 2020-10-15 NOTE — Progress Notes (Signed)
Spoke with patient late yesterday and suggested she go ahead and call [facility] to schedule medication, they have been trying to reach her. I spoke with [facility] this morning and it is scheduled to deliver on 10/18/20, patient has a $0 co-pay. I let Amy, triage nurse, know.

## 2020-10-20 ENCOUNTER — Telehealth: Payer: Self-pay

## 2020-10-20 NOTE — Telephone Encounter (Signed)
I spoke to pt, she received her Everolimus yesterday in the mail, she will start tomorrow. Pt reminded to take the medication @ the same time daily, with or without food. She knows to call us with any side effects that she may notice. She does have antiemetics, & is going to pick up prescribed mouthwashes to have on hand @ home if needed.

## 2020-10-21 NOTE — Telephone Encounter (Signed)
Pt notified of appt change from 10/28/20 to 11/05/2019 (for 2 wk f/u)

## 2020-10-26 ENCOUNTER — Other Ambulatory Visit: Payer: Self-pay

## 2020-10-26 DIAGNOSIS — R112 Nausea with vomiting, unspecified: Secondary | ICD-10-CM

## 2020-10-26 DIAGNOSIS — J45909 Unspecified asthma, uncomplicated: Secondary | ICD-10-CM

## 2020-10-26 DIAGNOSIS — T451X5A Adverse effect of antineoplastic and immunosuppressive drugs, initial encounter: Secondary | ICD-10-CM

## 2020-10-26 MED ORDER — IPRATROPIUM-ALBUTEROL 0.5-2.5 (3) MG/3ML IN SOLN: 3.0000 mL | RESPIRATORY_TRACT | 1 refills | Status: AC | PRN

## 2020-10-26 MED ORDER — PROCHLORPERAZINE MALEATE 10 MG PO TABS: 10.0000 mg | ORAL_TABLET | Freq: Three times a day (TID) | ORAL | 1 refills | Status: AC | PRN

## 2020-10-26 NOTE — Telephone Encounter (Signed)
Sent!

## 2020-10-28 ENCOUNTER — Inpatient Hospital Stay: Payer: Medicare Other | Admitting: Oncology

## 2020-10-28 ENCOUNTER — Inpatient Hospital Stay: Payer: Medicare Other

## 2020-11-01 ENCOUNTER — Other Ambulatory Visit: Payer: Self-pay | Admitting: Hematology and Oncology

## 2020-11-01 ENCOUNTER — Inpatient Hospital Stay: Payer: Medicare HMO | Attending: Oncology

## 2020-11-01 ENCOUNTER — Inpatient Hospital Stay: Payer: Medicare HMO

## 2020-11-01 ENCOUNTER — Other Ambulatory Visit: Payer: Self-pay

## 2020-11-01 VITALS — BP 127/85 | HR 83 | Temp 99.1°F | Resp 18 | Ht 61.0 in | Wt 143.8 lb

## 2020-11-01 DIAGNOSIS — C7951 Secondary malignant neoplasm of bone: Secondary | ICD-10-CM | POA: Insufficient documentation

## 2020-11-01 DIAGNOSIS — G4700 Insomnia, unspecified: Secondary | ICD-10-CM

## 2020-11-01 DIAGNOSIS — C7952 Secondary malignant neoplasm of bone marrow: Secondary | ICD-10-CM

## 2020-11-01 DIAGNOSIS — C50911 Malignant neoplasm of unspecified site of right female breast: Secondary | ICD-10-CM | POA: Diagnosis present

## 2020-11-01 LAB — BASIC METABOLIC PANEL
BUN: 11 (ref 4–21)
CO2: 28 — AB (ref 13–22)
Chloride: 105 (ref 99–108)
Creatinine: 0.7 (ref 0.5–1.1)
Glucose: 113
Potassium: 4 (ref 3.4–5.3)
Sodium: 139 (ref 137–147)

## 2020-11-01 LAB — CBC AND DIFFERENTIAL
HCT: 42 (ref 36–46)
Hemoglobin: 14.1 (ref 12.0–16.0)
Neutrophils Absolute: 2.54
Platelets: 160 (ref 150–399)
WBC: 3.9

## 2020-11-01 LAB — COMPREHENSIVE METABOLIC PANEL
Albumin: 3.9 (ref 3.5–5.0)
Calcium: 9 (ref 8.7–10.7)

## 2020-11-01 LAB — HEPATIC FUNCTION PANEL
ALT: 67 — AB (ref 7–35)
AST: 90 — AB (ref 13–35)
Alkaline Phosphatase: 102 (ref 25–125)
Bilirubin, Total: 0.5

## 2020-11-01 LAB — CBC: RBC: 4.43 (ref 3.87–5.11)

## 2020-11-01 MED ORDER — DENOSUMAB 120 MG/1.7ML ~~LOC~~ SOLN
SUBCUTANEOUS | Status: AC
  Filled 2020-11-01: qty 1.7

## 2020-11-01 MED ORDER — DENOSUMAB 120 MG/1.7ML ~~LOC~~ SOLN
120.0000 mg | Freq: Once | SUBCUTANEOUS | Status: AC
  Administered 2020-11-01: 120 mg via SUBCUTANEOUS

## 2020-11-01 MED ORDER — FENTANYL 50 MCG/HR TD PT72: MEDICATED_PATCH | TRANSDERMAL | 0 refills | Status: DC

## 2020-11-01 MED ORDER — FENTANYL 100 MCG/HR TD PT72: 1.0000 | MEDICATED_PATCH | TRANSDERMAL | 0 refills | Status: DC

## 2020-11-01 MED ORDER — ZOLPIDEM TARTRATE 10 MG PO TABS: 10.0000 mg | ORAL_TABLET | Freq: Every evening | ORAL | 1 refills | Status: DC | PRN

## 2020-11-01 NOTE — Patient Instructions (Signed)
Denosumab injection What is this medicine? DENOSUMAB (den oh sue mab) slows bone breakdown. Prolia is used to treat osteoporosis in women after menopause and in men, and in people who are taking corticosteroids for 6 months or more. Xgeva is used to treat a high calcium level due to cancer and to prevent bone fractures and other bone problems caused by multiple myeloma or cancer bone metastases. Xgeva is also used to treat giant cell tumor of the bone. This medicine may be used for other purposes; ask your health care provider or pharmacist if you have questions. COMMON BRAND NAME(S): Prolia, XGEVA What should I tell my health care provider before I take this medicine? They need to know if you have any of these conditions:  dental disease  having surgery or tooth extraction  infection  kidney disease  low levels of calcium or Vitamin D in the blood  malnutrition  on hemodialysis  skin conditions or sensitivity  thyroid or parathyroid disease  an unusual reaction to denosumab, other medicines, foods, dyes, or preservatives  pregnant or trying to get pregnant  breast-feeding How should I use this medicine? This medicine is for injection under the skin. It is given by a health care professional in a hospital or clinic setting. A special MedGuide will be given to you before each treatment. Be sure to read this information carefully each time. For Prolia, talk to your pediatrician regarding the use of this medicine in children. Special care may be needed. For Xgeva, talk to your pediatrician regarding the use of this medicine in children. While this drug may be prescribed for children as young as 13 years for selected conditions, precautions do apply. Overdosage: If you think you have taken too much of this medicine contact a poison control center or emergency room at once. NOTE: This medicine is only for you. Do not share this medicine with others. What if I miss a dose? It is  important not to miss your dose. Call your doctor or health care professional if you are unable to keep an appointment. What may interact with this medicine? Do not take this medicine with any of the following medications:  other medicines containing denosumab This medicine may also interact with the following medications:  medicines that lower your chance of fighting infection  steroid medicines like prednisone or cortisone This list may not describe all possible interactions. Give your health care provider a list of all the medicines, herbs, non-prescription drugs, or dietary supplements you use. Also tell them if you smoke, drink alcohol, or use illegal drugs. Some items may interact with your medicine. What should I watch for while using this medicine? Visit your doctor or health care professional for regular checks on your progress. Your doctor or health care professional may order blood tests and other tests to see how you are doing. Call your doctor or health care professional for advice if you get a fever, chills or sore throat, or other symptoms of a cold or flu. Do not treat yourself. This drug may decrease your body's ability to fight infection. Try to avoid being around people who are sick. You should make sure you get enough calcium and vitamin D while you are taking this medicine, unless your doctor tells you not to. Discuss the foods you eat and the vitamins you take with your health care professional. See your dentist regularly. Brush and floss your teeth as directed. Before you have any dental work done, tell your dentist you are   receiving this medicine. Do not become pregnant while taking this medicine or for 5 months after stopping it. Talk with your doctor or health care professional about your birth control options while taking this medicine. Women should inform their doctor if they wish to become pregnant or think they might be pregnant. There is a potential for serious side  effects to an unborn child. Talk to your health care professional or pharmacist for more information. What side effects may I notice from receiving this medicine? Side effects that you should report to your doctor or health care professional as soon as possible:  allergic reactions like skin rash, itching or hives, swelling of the face, lips, or tongue  bone pain  breathing problems  dizziness  jaw pain, especially after dental work  redness, blistering, peeling of the skin  signs and symptoms of infection like fever or chills; cough; sore throat; pain or trouble passing urine  signs of low calcium like fast heartbeat, muscle cramps or muscle pain; pain, tingling, numbness in the hands or feet; seizures  unusual bleeding or bruising  unusually weak or tired Side effects that usually do not require medical attention (report to your doctor or health care professional if they continue or are bothersome):  constipation  diarrhea  headache  joint pain  loss of appetite  muscle pain  runny nose  tiredness  upset stomach This list may not describe all possible side effects. Call your doctor for medical advice about side effects. You may report side effects to FDA at 1-800-FDA-1088. Where should I keep my medicine? This medicine is only given in a clinic, doctor's office, or other health care setting and will not be stored at home. NOTE: This sheet is a summary. It may not cover all possible information. If you have questions about this medicine, talk to your doctor, pharmacist, or health care provider.  2020 Elsevier/Gold Standard (2018-02-22 16:10:44)

## 2020-11-02 ENCOUNTER — Other Ambulatory Visit: Payer: Medicare Other

## 2020-11-02 ENCOUNTER — Ambulatory Visit: Payer: Medicare Other | Admitting: Hematology and Oncology

## 2020-11-03 ENCOUNTER — Other Ambulatory Visit: Payer: Self-pay

## 2020-11-03 DIAGNOSIS — C7951 Secondary malignant neoplasm of bone: Secondary | ICD-10-CM

## 2020-11-03 MED ORDER — OXYCODONE HCL 20 MG PO TABS: 1.0000 | ORAL_TABLET | ORAL | 0 refills | Status: DC | PRN

## 2020-11-03 NOTE — Telephone Encounter (Signed)
Sent!

## 2020-11-03 NOTE — Progress Notes (Incomplete)
[facility]  [address] [place],  [place]  [address] [phone]  Clinic Day:  11/03/2020  Referring physician: Bonnita Nasuti, MD   This document serves as a record of services personally performed by Hosie Poisson, MD. It was created on their behalf by Curry,Lauren E, a trained medical scribe. The creation of this record is based on the scribe's personal observations and the provider's statements to them.   CHIEF COMPLAINT:  CC: Metastatic breast cancer  Current Treatment:  Will plan for Afinitor 10 mg daily   HISTORY OF PRESENT ILLNESS:  Monica Poole is a 45 y.o. female with a history of stage IIB right breast cancer diagnosed in June 2018.  Estrogen and progesterone receptors were positive at 95% and HER2 negative.  Ki67 was 70%.  She was seen at [facility] in July 2018 for consultation, but did not pursue treatment with [place] at that time.  Testing for hereditary breast and ovarian cancer with the [facility] Hereditary Cancer panel test in July 2018 did not reveal any clinically significant mutation or variants of uncertain significance.  She was going to go to [facility] for a second opinion, but did not pursue this, and we did not hear back from her regarding her cancer.  Her port was removed in 2018 due to septic infection.  She went to [facility] in [place] and was placed on neoadjuvant chemotherapy for 8 months, but with limited response.  In March 2019, she underwent bilateral mastectomies, but had complications with her reconstruction.  She had expanders placed, but never had the permanent implants inserted.  She received adjuvant radiation.  She was then placed on letrozole, but only completed 6 months.  She started having pain in her right hip in February 2020.  Hip x-rays in April revealed multifocal sclerotic bony metastases.  She then underwent a biopsy of the left anterior iliac bone in May,  which confirmed metastatic carcinoma favoring breast primary.  She was placed on palbociclib/fulvestrant, but only completed 4 months.  These were stopped due to problems with insurance and transportation to [place].    We began seeing her again in November 2020 to manage her widespread bone metastases.  CT chest, abdomen, and pelvis in November revealed innumerable small sclerotic metastases throughout the axial and proximal appendicular skeleton, increased in size, number and degree of sclerosis, favoring progression of osseous metastatic disease.  No lymphadenopathy or other findings of extraosseous metastatic disease in the chest, abdomen, or pelvis.  She also underwent a nuclear medicine whole body bone scan, which revealed osseous metastases at left iliac bone, thoracic spine, and bilateral proximal femora.  We resumed palbociclib/fulvestrant in November.  She received palliative radiation to the bilateral hips completed in December.  She was referred to plastic surgeon regarding her expanders.  She had palmar erythrodysesthesia and was given information on that diagnosis and how to manage it.  Her pain medications were titrated and she did require higher doses of prednisone temporarily.  CT chest, abdomen and pelvis from May 3rd revealed development of a segment 3 hypoattenuating liver lesion, measuring 1.8 cm, concerning for new metastasis.  No other evidence of soft tissue metastasis within the chest, abdomen or pelvis.  Osseous metastasis are primarily similar.  New and progressive areas of sclerosis involving the T6 and S1 vertebral bodies were seen.  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  We have  steadily tapered her prednisone dose down.  Her calcium dose was increased to 600 mg TID.    Left diagnostic mammogram and left breast ultrasound from October 7th revealed an indeterminate 1.2 cm mass involving the lower outer quadrant of the reconstructed left  breast at the inframammary fold.  This may represent focal post surgical dense fibrosis or fat necrosis, though an oil cyst could not be confirmed on the spot tangential mammogram.  Biopsy was recommended and scheduled, but she missed this appointment.  CT chest, abdomen and pelvis from October 7th revealed interval increase in the size of a hypodense metastatic lesion of the left lobe of the liver measuring 3.2 x 2.4 cm, previously 2.1 x 2.0 cm.  Suspect subtle, new hypodense lesions of the superior left lobe of the liver anddjacent liver dome, and interval increase in multiple sclerotic osseous lesions throughout the vertebral bodies, sternum and pelvis.  Overall constellation of findings were consistent with worsened metastatic disease and so her fulvestrant and palbociclib were stopped.  She was scheduled with her dentist on October 28th for a broken tooth, so we held denosumab.  Her white count had decreased from 16.5 to 1.8 with an [place] of 850 from her palbociclib.  She is now on fentanyl at 150 mcg every 3 days.  She was placed on Lasix to use as needed for edema.  When we met at the end of October we discussed the other options of chemotherapy.  INTERVAL HISTORY:  Monica Poole is here for follow up and states that she just picked up her Fentanyl and Xanax prescription up from October as her pharmacy did not have these prescriptions at the time and did not call to alert her when they came in.  She is out of oxycodone 20 mg Q4H, pantoprazole 40 mg BID and Lasix 20 mg prn so I will refill these today.  She rates her pain as a 6/10 today of her bilateral hips.  She was having significant edema of her feet and ankles last month, up to a 2+.  She has completely tapered off of prednisone.  She is scheduled for Xgeva on December 6th.  Blood counts and chemistries are unremarkable except for mildly abnormal liver function tests.  Her  appetite is good, and she has lost 5 pounds since her last visit.  She denies fever,  chills or other signs of infection.  She denies nausea, vomiting, bowel issues, or abdominal pain.  She denies sore throat, cough, dyspnea, or chest pain.  Clarivel is here for follow up after starting treatment with Aromasin 25 mg daily and Afinitor 10 mg daily.   Her  appetite is good, and she has gained/lost _ pounds since her last visit.  She denies fever, chills or other signs of infection.  She denies nausea, vomiting, bowel issues, or abdominal pain.  She denies sore throat, cough, dyspnea, or chest pain.  REVIEW OF SYSTEMS:  Review of Systems - Oncology   VITALS:  There were no vitals taken for this visit.  Wt Readings from Last 3 Encounters:  11/01/20 143 lb 12 oz (65.2 kg)  10/04/20 147 lb 1 oz (66.7 kg)  09/30/20 145 lb 11.2 oz (66.1 kg)    There is no height or weight on file to calculate BMI.  Performance status (ECOG): 1 - Symptomatic but completely ambulatory  PHYSICAL EXAM:  Physical Exam  LABS:   CBC Latest Ref Rng & Units 11/01/2020 09/30/2020 08/25/2020  WBC - 3.9 4.9 1.8  Hemoglobin  12.0 - 16.0 14.1 13.4 13.1  Hematocrit 36 - 46 42 39 39  Platelets 150 - 399 160 239 262   CMP Latest Ref Rng & Units 11/01/2020 09/30/2020 08/25/2020  Glucose 70 - 99 mg/dL - - -  BUN 4 - '21 11 10 7  ' Creatinine 0.5 - 1.1 0.7 0.7 0.8  Sodium 137 - 147 139 137 138  Potassium 3.4 - 5.3 4.0 3.8 2.9(A)  Chloride 99 - 108 105 105 101  CO2 13 - 22 28(A) 27(A) 32(A)  Calcium 8.7 - 10.7 9.0 9.6 8.3(A)  Total Protein 6.5 - 8.1 g/dL - - -  Total Bilirubin 0.3 - 1.2 mg/dL - - -  Alkaline Phos 25 - 125 102 67 70  AST 13 - 35 90(A) 49(A) 37(A)  ALT 7 - 35 67(A) 36(A) 24     STUDIES:  No results found.   Allergies:  Allergies  Allergen Reactions  . Bee Venom Anaphylaxis  . Dicyclomine Other (See Comments)    CARDIAC ARREST  Other reaction(s): Other (See Comments), Other (See Comments) [place]  . Latex Rash and Other (See Comments)    BLISTERS  Other  reaction(s): Other (See Comments) BLISTERS  . Wasp Venom Protein Anaphylaxis  . Bentyl [Dicyclomine Hcl] Other (See Comments)    CARDIAC ARREST  . Other Other (See Comments) and Rash    Blisters, also- ONLY PAPER TAPE!!  . Tape Rash and Other (See Comments)    Blisters, also- ONLY PAPER TAPE!!    Current Medications: Current Outpatient Medications  Medication Sig Dispense Refill  . albuterol (ACCUNEB) 1.25 MG/3ML nebulizer solution Take 1 ampule by nebulization every 4 (four) hours as needed for wheezing.     Marland Kitchen albuterol (VENTOLIN HFA) 108 (90 Base) MCG/ACT inhaler Can inhale two puffs every four to six hours as needed for cough or wheeze. 8 g 1  . ALPRAZolam (XANAX) 0.5 MG tablet Take 1 tablet by mouth 4 (four) times daily.    . budesonide (PULMICORT) 0.5 MG/2ML nebulizer solution Take 0.5 mg by nebulization 2 (two) times daily as needed (wheezing).    . calcium-vitamin D (OSCAL WITH D) 500-200 MG-UNIT tablet Take 1 tablet by mouth 2 (two) times daily.     Marland Kitchen denosumab (XGEVA) 120 MG/1.7ML SOLN injection Inject 120 mg into the skin once.    Marland Kitchen exemestane (AROMASIN) 25 MG tablet Take 1 tablet (25 mg total) by mouth daily after breakfast. 30 tablet 5  . fentaNYL (DURAGESIC) 100 MCG/HR Place 1 patch onto the skin every 3 (three) days. 5 patch 0  . fentaNYL (DURAGESIC) 50 MCG/HR SMARTSIG:50 MCG Topical Every 3 Days 5 patch 0  . Fluticasone-Umeclidin-Vilant (TRELEGY ELLIPTA) 200-62.5-25 MCG/INH AEPB Inhale 1 Dose into the lungs daily. Rinse, gargle, and spit after use. (Patient taking differently: Inhale 1 Dose into the lungs daily as needed. Rinse, gargle, and spit after use.) 60 each 5  . furosemide (LASIX) 20 MG tablet Take 1 tablet (20 mg total) by mouth daily. 30 tablet 5  . ipratropium (ATROVENT) 0.06 % nasal spray Can use one to two sprays in each nostril every six hours as needed to dry up nose. 15 mL 5  . ipratropium-albuterol (DUONEB) 0.5-2.5 (3) MG/3ML SOLN Take 3 mLs by nebulization  every 4 (four) hours as needed. 360 mL 1  . magic mouthwash w/lidocaine SOLN Take 5 mLs by mouth 4 (four) times daily as needed for mouth pain. 320 mL 3  . mometasone-formoterol (DULERA) 200-5  MCG/ACT AERO Inhale two puffs twice daily to prevent cough or wheeze.  Rinse, gargle, and spit after use. (Patient taking differently: Inhale 2 puffs into the lungs 2 (two) times daily as needed (for "flares" and rinse mouth afterwards). ) 1 Inhaler 5  . ondansetron (ZOFRAN) 4 MG tablet Take 1 tablet (4 mg total) by mouth every 8 (eight) hours as needed for nausea or vomiting. 20 tablet 0  . Oxycodone HCl 20 MG TABS Take 1 tablet by mouth every 4 (four) hours as needed. Refilled on 09/30/20    . pantoprazole (PROTONIX) 40 MG tablet Take 1 tablet (40 mg total) by mouth 2 (two) times daily. 60 tablet 5  . Potassium Chloride ER 20 MEQ TBCR Take 1 tablet by mouth 2 (two) times daily.    . predniSONE (DELTASONE) 20 MG tablet Take 1 tablet (20 mg total) by mouth 2 (two) times daily with a meal for 20 days. 40 tablet 0  . prochlorperazine (COMPAZINE) 10 MG tablet Take 1 tablet (10 mg total) by mouth every 8 (eight) hours as needed for nausea or vomiting. 30 tablet 1  . triamcinolone cream (KENALOG) 0.1 % Apply 1 application topically as needed (to affected, irritated sites). Prn     . venlafaxine XR (EFFEXOR-XR) 150 MG 24 hr capsule Take 150 mg by mouth daily.    Marland Kitchen venlafaxine XR (EFFEXOR-XR) 75 MG 24 hr capsule TAKE 1 CAPSULE BY MOUTH DAILY ALONG WITH 150 MG 90 capsule 3  . zolpidem (AMBIEN) 10 MG tablet Take 1 tablet (10 mg total) by mouth at bedtime as needed for sleep. 30 tablet 1   No current facility-administered medications for this visit.     ASSESSMENT & PLAN:   Assessment:   1. Stage IIB right breast cancer diagnosed in June 2018.  This was treated at the [facility] in [place], [place] with neoadjuvant chemotherapy, and adjuvant radiation.  She underwent bilateral mastectomies in  March 2019.   2. History of cervical cancer at a young age, treated with surgery and radiation.  3. 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  She was placed on palbociclib/fulvestrant, but only completed 4 months of therapy, then discontinued that on her own.  We assumed follow-up and restarted palbociclib/fulvestrant in November 2020, and started her on monthly denosumab, but she has missed some doses.  She received palliative radiation to the bilateral hips completed in December 2020.  Palbociclib was resumed in May 2021, and then placed on hold this summer due to poor healing of her reconstruction.  We resumed this at the end of June and now have stopped it as of the end of October due to progression of disease.  4. Depression, now on Effexor dose at 225 mg daily with fairly good control of her symptoms.  She still lacks motivation at times.  5. Bilateral breast reconstruction, with implants placed on February 17th.  This was healing well, but had to be removed due to the skin splitting open.  I do feel a nodule in the lateral left reconstruction and a biopsy was scheduled.  In view of these findings, we will hold off for now, and monitor this area.  6. Diagnosis of breast cancer at a very young age.  She did not have clinically significant mutation or variants of uncertain significance on [facility] myRisk done in July 2018.  Testing for PIK3CA was negative.  7. Lymphedema of the right upper extremity.  8. 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   9. Hypodense metastatic liver lesion of the left lobe, and increased, now measuring 3.2 x 2.4 cm.  Suspect subtle, new hypodense lesions were also observed on most recent CT imaging in October.  10. Mild hypocalcemia, resolved.  We will have her continue oral calcium 600 mg three times daily.  Plan: We discussed treatment planning today in view of recent progression  of disease.  Options for oral therapy would include hormonal therapy with Aromasin as well as oral chemotherapy with Afinitor 10 mg daily.  Systemic options would include Taxotere, eribulin, Navelbine, gemcitabine or a combination of gemcitabine/Navelbine.  I reviewed the schedule and possible toxicities associated with these therapies.  The patient declines port placement due to previous complications, but is agreeable to a PICC line for systemic treatment.  Therefore we will proceed with Aromasin 25 mg daily and Afinitor 10 mg daily, and she is in agreement.  I will go ahead and send in the prescription for Aromasin 25 mg daily.  We will schedule her for an education session with Melissa on Monday.  I will send in a prescription for dexamethasone rinse as a preventative as well as Magic Mouthwash.  She will proceed with Delton See on December 6th.  I will send in refills for oxycodone 20 mg Q4H, pantoprazole 40 mg BID, and Lasix 20 mg prn.  We will see her back in 4 weeks with CBC and CMP prior to her next Xgeva.  The patient verbalizes understanding of an agreement to the plan today.  She knows to call the office should any new questions or concerns arise.   I provided 25 minutes of face-to-face time during this this encounter and > 50% was spent counseling as documented under my assessment and plan.    Derwood Kaplan, MD [facility] AT [facility] [address] [place] [address] Dept: [phone] Dept Fax: [phone]   I, Rita Ohara, am acting as scribe for Derwood Kaplan, MD  I have reviewed this report as typed by the medical scribe, and it is complete and accurate.

## 2020-11-04 ENCOUNTER — Other Ambulatory Visit: Payer: Self-pay | Admitting: Oncology

## 2020-11-04 ENCOUNTER — Telehealth: Payer: Self-pay

## 2020-11-04 ENCOUNTER — Inpatient Hospital Stay: Payer: Medicare HMO | Admitting: Oncology

## 2020-11-04 ENCOUNTER — Other Ambulatory Visit: Payer: Self-pay

## 2020-11-04 DIAGNOSIS — C50411 Malignant neoplasm of upper-outer quadrant of right female breast: Secondary | ICD-10-CM

## 2020-11-04 DIAGNOSIS — C7951 Secondary malignant neoplasm of bone: Secondary | ICD-10-CM

## 2020-11-04 DIAGNOSIS — C7952 Secondary malignant neoplasm of bone marrow: Secondary | ICD-10-CM

## 2020-11-04 DIAGNOSIS — Z170 Estrogen receptor positive status [ER+]: Secondary | ICD-10-CM

## 2020-11-04 MED ORDER — ALPRAZOLAM 0.5 MG PO TABS: 0.5000 mg | ORAL_TABLET | Freq: Four times a day (QID) | ORAL | 0 refills | Status: DC

## 2020-11-04 MED ORDER — ONDANSETRON HCL 4 MG PO TABS: 4.0000 mg | ORAL_TABLET | Freq: Three times a day (TID) | ORAL | 0 refills | Status: DC | PRN

## 2020-11-04 NOTE — Telephone Encounter (Signed)
Patient states the pharmacy only gave her 5-121mcg Fentanyl patches and no . Started the Afinitor on 10/21/20. The pharmacy was short on Oxycodone's, they gave her 99 tablets of the prescribed amount. Will send a medication request for Xanax and Zofran to [facility], NP.

## 2020-11-04 NOTE — Telephone Encounter (Signed)
Sent!

## 2020-11-08 NOTE — Progress Notes (Incomplete)
[facility]  [address] [place],  [place]  [address] [phone]  Clinic Day:  11/08/2020  Referring physician: Bonnita Nasuti, MD   This document serves as a record of services personally performed by Hosie Poisson, MD. It was created on their behalf by Curry,Lauren E, a trained medical scribe. The creation of this record is based on the scribe's personal observations and the provider's statements to them.   CHIEF COMPLAINT:  CC: Metastatic breast cancer  Current Treatment:  Will plan for Afinitor 10 mg daily   HISTORY OF PRESENT ILLNESS:  Monica Poole is a 45 y.o. female with a history of stage IIB right breast cancer diagnosed in June 2018.  Estrogen and progesterone receptors were positive at 95% and HER2 negative.  Ki67 was 70%.  She was seen at [facility] in July 2018 for consultation, but did not pursue treatment with [place] at that time.  Testing for hereditary breast and ovarian cancer with the [facility] Hereditary Cancer panel test in July 2018 did not reveal any clinically significant mutation or variants of uncertain significance.  She was going to go to [facility] for a second opinion, but did not pursue this, and we did not hear back from her regarding her cancer.  Her port was removed in 2018 due to septic infection.  She went to [facility] in [place] and was placed on neoadjuvant chemotherapy for 8 months, but with limited response.  In March 2019, she underwent bilateral mastectomies, but had complications with her reconstruction.  She had expanders placed, but never had the permanent implants inserted.  She received adjuvant radiation.  She was then placed on letrozole, but only completed 6 months.  She started having pain in her right hip in February 2020.  Hip x-rays in April revealed multifocal sclerotic bony metastases.  She then underwent a biopsy of the left anterior iliac bone in May,  which confirmed metastatic carcinoma favoring breast primary.  She was placed on palbociclib/fulvestrant, but only completed 4 months.  These were stopped due to problems with insurance and transportation to [place].    We began seeing her again in November 2020 to manage her widespread bone metastases.  CT chest, abdomen, and pelvis in November revealed innumerable small sclerotic metastases throughout the axial and proximal appendicular skeleton, increased in size, number and degree of sclerosis, favoring progression of osseous metastatic disease.  No lymphadenopathy or other findings of extraosseous metastatic disease in the chest, abdomen, or pelvis.  She also underwent a nuclear medicine whole body bone scan, which revealed osseous metastases at left iliac bone, thoracic spine, and bilateral proximal femora.  We resumed palbociclib/fulvestrant in November.  She received palliative radiation to the bilateral hips completed in December.  She was referred to plastic surgeon regarding her expanders.  She had palmar erythrodysesthesia and was given information on that diagnosis and how to manage it.  Her pain medications were titrated and she did require higher doses of prednisone temporarily.  CT chest, abdomen and pelvis from May 3rd revealed development of a segment 3 hypoattenuating liver lesion, measuring 1.8 cm, concerning for new metastasis.  No other evidence of soft tissue metastasis within the chest, abdomen or pelvis.  Osseous metastasis are primarily similar.  New and progressive areas of sclerosis involving the T6 and S1 vertebral bodies were seen.  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  We have  steadily tapered her prednisone dose down.  Her calcium dose was increased to 600 mg TID.    Left diagnostic mammogram and left breast ultrasound from October 7th revealed an indeterminate 1.2 cm mass involving the lower outer quadrant of the reconstructed left  breast at the inframammary fold.  This may represent focal post surgical dense fibrosis or fat necrosis, though an oil cyst could not be confirmed on the spot tangential mammogram.  Biopsy was recommended and scheduled, but she missed this appointment.  CT chest, abdomen and pelvis from October 7th revealed interval increase in the size of a hypodense metastatic lesion of the left lobe of the liver measuring 3.2 x 2.4 cm, previously 2.1 x 2.0 cm.  Suspect subtle, new hypodense lesions of the superior left lobe of the liver anddjacent liver dome, and interval increase in multiple sclerotic osseous lesions throughout the vertebral bodies, sternum and pelvis.  Overall constellation of findings were consistent with worsened metastatic disease and so her fulvestrant and palbociclib were stopped.  She was scheduled with her dentist on October 28th for a broken tooth, so we held denosumab.  Her white count had decreased from 16.5 to 1.8 with an [place] of 850 from her palbociclib.  She is now on fentanyl at 150 mcg every 3 days.  She was placed on Lasix to use as needed for edema.  When we met at the end of October we discussed the other options of chemotherapy.  INTERVAL HISTORY:  Monica Poole is here for follow up and states that she just picked up her Fentanyl and Xanax prescription up from October as her pharmacy did not have these prescriptions at the time and did not call to alert her when they came in.  She is out of oxycodone 20 mg Q4H, pantoprazole 40 mg BID and Lasix 20 mg prn so I will refill these today.  She rates her pain as a 6/10 today of her bilateral hips.  She was having significant edema of her feet and ankles last month, up to a 2+.  She has completely tapered off of prednisone.  She is scheduled for Xgeva on December 6th.  Blood counts and chemistries are unremarkable except for mildly abnormal liver function tests.  Her  appetite is good, and she has lost 5 pounds since her last visit.  She denies fever,  chills or other signs of infection.  She denies nausea, vomiting, bowel issues, or abdominal pain.  She denies sore throat, cough, dyspnea, or chest pain.  [facility] is here for routine follow up ***.  She started Aromasin 25 mg daily and Afinitor 10 mg daily in ***, and has tolerated this without significant difficulty.   Her  appetite is good, and she has gained/lost _ pounds since her last visit.  She denies fever, chills or other signs of infection.  She denies nausea, vomiting, bowel issues, or abdominal pain.  She denies sore throat, cough, dyspnea, or chest pain.  REVIEW OF SYSTEMS:  Review of Systems - Oncology   VITALS:  There were no vitals taken for this visit.  Wt Readings from Last 3 Encounters:  11/01/20 143 lb 12 oz (65.2 kg)  10/04/20 147 lb 1 oz (66.7 kg)  09/30/20 145 lb 11.2 oz (66.1 kg)    There is no height or weight on file to calculate BMI.  Performance status (ECOG): 1 - Symptomatic but completely ambulatory  PHYSICAL EXAM:  Physical Exam  LABS:   CBC Latest Ref Rng & Units 11/01/2020  09/30/2020 08/25/2020  WBC - 3.9 4.9 1.8  Hemoglobin 12.0 - 16.0 14.1 13.4 13.1  Hematocrit 36 - 46 42 39 39  Platelets 150 - 399 160 239 262   CMP Latest Ref Rng & Units 11/01/2020 09/30/2020 08/25/2020  Glucose 70 - 99 mg/dL - - -  BUN 4 - '21 11 10 7  ' Creatinine 0.5 - 1.1 0.7 0.7 0.8  Sodium 137 - 147 139 137 138  Potassium 3.4 - 5.3 4.0 3.8 2.9(A)  Chloride 99 - 108 105 105 101  CO2 13 - 22 28(A) 27(A) 32(A)  Calcium 8.7 - 10.7 9.0 9.6 8.3(A)  Total Protein 6.5 - 8.1 g/dL - - -  Total Bilirubin 0.3 - 1.2 mg/dL - - -  Alkaline Phos 25 - 125 102 67 70  AST 13 - 35 90(A) 49(A) 37(A)  ALT 7 - 35 67(A) 36(A) 24     STUDIES:  No results found.   Allergies:  Allergies  Allergen Reactions  . Bee Venom Anaphylaxis  . Dicyclomine Other (See Comments)    CARDIAC ARREST  Other reaction(s): Other (See Comments), Other (See Comments) [place]  . Latex  Rash and Other (See Comments)    BLISTERS  Other reaction(s): Other (See Comments) BLISTERS  . Wasp Venom Protein Anaphylaxis  . Bentyl [Dicyclomine Hcl] Other (See Comments)    CARDIAC ARREST  . Other Other (See Comments) and Rash    Blisters, also- ONLY PAPER TAPE!!  . Tape Rash and Other (See Comments)    Blisters, also- ONLY PAPER TAPE!!    Current Medications: Current Outpatient Medications  Medication Sig Dispense Refill  . albuterol (ACCUNEB) 1.25 MG/3ML nebulizer solution Take 1 ampule by nebulization every 4 (four) hours as needed for wheezing.     Marland Kitchen albuterol (VENTOLIN HFA) 108 (90 Base) MCG/ACT inhaler Can inhale two puffs every four to six hours as needed for cough or wheeze. 8 g 1  . ALPRAZolam (XANAX) 0.5 MG tablet Take 1 tablet (0.5 mg total) by mouth 4 (four) times daily. 120 tablet 0  . budesonide (PULMICORT) 0.5 MG/2ML nebulizer solution Take 0.5 mg by nebulization 2 (two) times daily as needed (wheezing).    . calcium-vitamin D (OSCAL WITH D) 500-200 MG-UNIT tablet Take 1 tablet by mouth 2 (two) times daily.     Marland Kitchen denosumab (XGEVA) 120 MG/1.7ML SOLN injection Inject 120 mg into the skin once.    Marland Kitchen exemestane (AROMASIN) 25 MG tablet Take 1 tablet (25 mg total) by mouth daily after breakfast. 30 tablet 5  . fentaNYL (DURAGESIC) 100 MCG/HR Place 1 patch onto the skin every 3 (three) days. 5 patch 0  . fentaNYL (DURAGESIC) 50 MCG/HR SMARTSIG:50 MCG Topical Every 3 Days 5 patch 0  . Fluticasone-Umeclidin-Vilant (TRELEGY ELLIPTA) 200-62.5-25 MCG/INH AEPB Inhale 1 Dose into the lungs daily. Rinse, gargle, and spit after use. (Patient taking differently: Inhale 1 Dose into the lungs daily as needed. Rinse, gargle, and spit after use.) 60 each 5  . furosemide (LASIX) 20 MG tablet Take 1 tablet (20 mg total) by mouth daily. 30 tablet 5  . ipratropium (ATROVENT) 0.06 % nasal spray Can use one to two sprays in each nostril every six hours as needed to dry up nose. 15 mL 5  .  ipratropium-albuterol (DUONEB) 0.5-2.5 (3) MG/3ML SOLN Take 3 mLs by nebulization every 4 (four) hours as needed. 360 mL 1  . magic mouthwash w/lidocaine SOLN Take 5 mLs by mouth 4 (four) times  daily as needed for mouth pain. 320 mL 3  . mometasone-formoterol (DULERA) 200-5 MCG/ACT AERO Inhale two puffs twice daily to prevent cough or wheeze.  Rinse, gargle, and spit after use. (Patient taking differently: Inhale 2 puffs into the lungs 2 (two) times daily as needed (for "flares" and rinse mouth afterwards). ) 1 Inhaler 5  . ondansetron (ZOFRAN) 4 MG tablet Take 1 tablet (4 mg total) by mouth every 8 (eight) hours as needed for nausea or vomiting. 20 tablet 0  . Oxycodone HCl 20 MG TABS Take 1 tablet (20 mg total) by mouth every 4 (four) hours as needed. Refilled on 09/30/20 180 tablet 0  . pantoprazole (PROTONIX) 40 MG tablet Take 1 tablet (40 mg total) by mouth 2 (two) times daily. 60 tablet 5  . Potassium Chloride ER 20 MEQ TBCR Take 1 tablet by mouth 2 (two) times daily.    . prochlorperazine (COMPAZINE) 10 MG tablet Take 1 tablet (10 mg total) by mouth every 8 (eight) hours as needed for nausea or vomiting. 30 tablet 1  . triamcinolone cream (KENALOG) 0.1 % Apply 1 application topically as needed (to affected, irritated sites). Prn     . venlafaxine XR (EFFEXOR-XR) 150 MG 24 hr capsule Take 150 mg by mouth daily.    Marland Kitchen venlafaxine XR (EFFEXOR-XR) 75 MG 24 hr capsule TAKE 1 CAPSULE BY MOUTH DAILY ALONG WITH 150 MG 90 capsule 3  . zolpidem (AMBIEN) 10 MG tablet Take 1 tablet (10 mg total) by mouth at bedtime as needed for sleep. 30 tablet 1   No current facility-administered medications for this visit.     ASSESSMENT & PLAN:   Assessment:   1. Stage IIB right breast cancer diagnosed in June 2018.  This was treated at the [facility] in [place], [place] with neoadjuvant chemotherapy, and adjuvant radiation.  She underwent bilateral mastectomies in March 2019.   2. History  of cervical cancer at a young age, treated with surgery and radiation.  3. 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  She was placed on palbociclib/fulvestrant, but only completed 4 months of therapy, then discontinued that on her own.  We assumed follow-up and restarted palbociclib/fulvestrant in November 2020, and started her on monthly denosumab, but she has missed some doses.  She received palliative radiation to the bilateral hips completed in December 2020.  Palbociclib was resumed in May 2021, and then placed on hold this summer due to poor healing of her reconstruction.  We resumed this at the end of June and now have stopped it as of the end of October due to progression of disease.  4. Depression, now on Effexor dose at 225 mg daily with fairly good control of her symptoms.  She still lacks motivation at times.  5. Bilateral breast reconstruction, with implants placed on February 17th.  This was healing well, but had to be removed due to the skin splitting open.  I do feel a nodule in the lateral left reconstruction and a biopsy was scheduled.  In view of these findings, we will hold off for now, and monitor this area.  6. Diagnosis of breast cancer at a very young age.  She did not have clinically significant mutation or variants of uncertain significance on [facility] myRisk done in July 2018.  Testing for PIK3CA was negative.  7. Lymphedema of the right upper extremity.    8. 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  9. Hypodense metastatic liver lesion of the left lobe, and increased, now measuring 3.2 x 2.4 cm.  Suspect subtle, new hypodense lesions were also observed on most recent CT imaging in October.  10. Mild hypocalcemia, resolved.  We will have her continue oral calcium 600 mg three times daily.  Plan: Therefore we will proceed with Aromasin 25 mg daily and Afinitor 10 mg daily, and she is in  agreement.  I will go ahead and send in the prescription for Aromasin 25 mg daily.   I will send in a prescription for dexamethasone rinse as a preventative as well as Magic Mouthwash.  She will proceed with Delton See on December 6th.  I will send in refills for oxycodone 20 mg Q4H, pantoprazole 40 mg BID, and Lasix 20 mg prn.  We will see her back in 4 weeks with CBC and CMP prior to her next Xgeva.  The patient verbalizes understanding of an agreement to the plan today.  She knows to call the office should any new questions or concerns arise.   I provided 25 minutes of face-to-face time during this this encounter and > 50% was spent counseling as documented under my assessment and plan.    Derwood Kaplan, MD [facility] AT [facility] [address] [place] [address] Dept: [phone] Dept Fax: [phone]   I, Rita Ohara, am acting as scribe for Derwood Kaplan, MD  I have reviewed this report as typed by the medical scribe, and it is complete and accurate.

## 2020-11-09 ENCOUNTER — Inpatient Hospital Stay: Payer: Medicare HMO | Admitting: Oncology

## 2020-11-09 ENCOUNTER — Ambulatory Visit: Payer: Medicare Other | Admitting: Oncology

## 2020-11-09 DIAGNOSIS — C7951 Secondary malignant neoplasm of bone: Secondary | ICD-10-CM

## 2020-11-09 DIAGNOSIS — C50411 Malignant neoplasm of upper-outer quadrant of right female breast: Secondary | ICD-10-CM

## 2020-11-09 DIAGNOSIS — C787 Secondary malignant neoplasm of liver and intrahepatic bile duct: Secondary | ICD-10-CM

## 2020-11-10 ENCOUNTER — Other Ambulatory Visit: Payer: Self-pay

## 2020-11-10 ENCOUNTER — Telehealth: Payer: Self-pay

## 2020-11-10 DIAGNOSIS — C7951 Secondary malignant neoplasm of bone: Secondary | ICD-10-CM

## 2020-11-10 MED ORDER — FENTANYL 50 MCG/HR TD PT72: MEDICATED_PATCH | TRANSDERMAL | 0 refills | Status: DC

## 2020-11-10 NOTE — Telephone Encounter (Signed)
I spoke with pt. She is taking the Afinitor @ midnight daily, with either a cup of jello or pudding. Denies missing doses. Pt does report, "2 huge sores on top of mouth, like where my wisdom teeth used to be. If I eat anything except soup, jello, or puddings, they bleed. Right now, they kinda look brownish, like they are trying to heal. She is using the dexamethasone rinses. Afebrile. No N/V. No diarrhea.  Appetite not really good. Just FYI. [phone].

## 2020-11-12 ENCOUNTER — Ambulatory Visit: Payer: Medicare HMO | Admitting: Oncology

## 2020-11-18 ENCOUNTER — Other Ambulatory Visit: Payer: Self-pay | Admitting: Hematology and Oncology

## 2020-11-18 ENCOUNTER — Other Ambulatory Visit: Payer: Self-pay | Admitting: Pharmacist

## 2020-11-18 MED ORDER — PHENAZOPYRIDINE HCL 100 MG PO TABS: 100.0000 mg | ORAL_TABLET | Freq: Three times a day (TID) | ORAL | 0 refills | Status: AC | PRN

## 2020-11-18 MED ORDER — NITROFURANTOIN MONOHYD MACRO 100 MG PO CAPS: 100.0000 mg | ORAL_CAPSULE | Freq: Two times a day (BID) | ORAL | 1 refills | Status: DC

## 2020-11-23 NOTE — Progress Notes (Incomplete)
[facility]  [address] [place],  [place]  [address] [phone]  Clinic Day:  11/23/2020  Referring physician: Bonnita Nasuti, MD   This document serves as a record of services personally performed by Hosie Poisson, MD. It was created on their behalf by Curry,Lauren E, a trained medical scribe. The creation of this record is based on the scribe's personal observations and the provider's statements to them.   CHIEF COMPLAINT:  CC: Metastatic breast cancer  Current Treatment:  Will plan for Aromasin 25 mg daily and Afinitor 10 mg daily   HISTORY OF PRESENT ILLNESS:  Monica Poole is a 45 y.o. female with a history of stage IIB right breast cancer diagnosed in June 2018.  Estrogen and progesterone receptors were positive at 95% and HER2 negative.  Ki67 was 70%.  She was seen at [facility] in July 2018 for consultation, but did not pursue treatment with [place] at that time.  Testing for hereditary breast and ovarian cancer with the [facility] Hereditary Cancer panel test in July 2018 did not reveal any clinically significant mutation or variants of uncertain significance.  She was going to go to [facility] for a second opinion, but did not pursue this, and we did not hear back from her regarding her cancer.  Her port was removed in 2018 due to septic infection.  She went to [facility] in [place] and was placed on neoadjuvant chemotherapy for 8 months, but with limited response.  In March 2019, she underwent bilateral mastectomies, but had complications with her reconstruction.  She had expanders placed, but never had the permanent implants inserted.  She received adjuvant radiation.  She was then placed on letrozole, but only completed 6 months.  She started having pain in her right hip in February 2020.  Hip x-rays in April revealed multifocal sclerotic bony metastases.  She then underwent a biopsy of the left  anterior iliac bone in May, which confirmed metastatic carcinoma favoring breast primary.  She was placed on palbociclib/fulvestrant, but only completed 4 months.  These were stopped due to problems with insurance and transportation to [place].    We began seeing her again in November 2020 to manage her widespread bone metastases.  CT chest, abdomen, and pelvis in November revealed innumerable small sclerotic metastases throughout the axial and proximal appendicular skeleton, increased in size, number and degree of sclerosis, favoring progression of osseous metastatic disease.  No lymphadenopathy or other findings of extraosseous metastatic disease in the chest, abdomen, or pelvis.  She also underwent a nuclear medicine whole body bone scan, which revealed osseous metastases at left iliac bone, thoracic spine, and bilateral proximal femora.  We resumed palbociclib/fulvestrant in November.  She received palliative radiation to the bilateral hips completed in December.  She was referred to plastic surgeon regarding her expanders.  She had palmar erythrodysesthesia and was given information on that diagnosis and how to manage it.  Her pain medications were titrated and she did require higher doses of prednisone temporarily.  CT chest, abdomen and pelvis from May 3rd revealed development of a segment 3 hypoattenuating liver lesion, measuring 1.8 cm, concerning for new metastasis.  No other evidence of soft tissue metastasis within the chest, abdomen or pelvis.  Osseous metastasis are primarily similar.  New and progressive areas of sclerosis involving the T6 and S1 vertebral bodies were seen.  She then developed severe edema and steroid myopathy and ended up in  a wheelchair.  We have steadily tapered her prednisone dose down.  Her calcium dose was increased to 600 mg TID.    Left diagnostic mammogram and left breast ultrasound from October 7th revealed an indeterminate 1.2 cm mass involving the lower outer quadrant  of the reconstructed left breast at the inframammary fold.  This may represent focal post surgical dense fibrosis or fat necrosis, though an oil cyst could not be confirmed on the spot tangential mammogram.  Biopsy was recommended and scheduled, but she missed this appointment.  CT chest, abdomen and pelvis from October 7th revealed interval increase in the size of a hypodense metastatic lesion of the left lobe of the liver measuring 3.2 x 2.4 cm, previously 2.1 x 2.0 cm.  Suspect subtle, new hypodense lesions of the superior left lobe of the liver anddjacent liver dome, and interval increase in multiple sclerotic osseous lesions throughout the vertebral bodies, sternum and pelvis.  Overall constellation of findings were consistent with worsened metastatic disease and so her fulvestrant and palbociclib were stopped.  She was scheduled with her dentist on October 28th for a broken tooth, so we held denosumab.  Her white count had decreased from 16.5 to 1.8 with an [place] of 850 from her palbociclib.  She is now on fentanyl at 150 mcg every 3 days.  She was placed on Lasix to use as needed for edema.  When we met at the end of October we discussed the other options of chemotherapy.  INTERVAL HISTORY:  Monica Poole is here for follow up and states that she just picked up her Fentanyl and Xanax prescription up from October as her pharmacy did not have these prescriptions at the time and did not call to alert her when they came in.  She is out of oxycodone 20 mg Q4H, pantoprazole 40 mg BID and Lasix 20 mg prn so I will refill these today.  She rates her pain as a 6/10 today of her bilateral hips.  She was having significant edema of her feet and ankles last month, up to a 2+.  She has completely tapered off of prednisone.  She is scheduled for Xgeva on December 6th.  Blood counts and chemistries are unremarkable except for mildly abnormal liver function tests.  Her  appetite is good, and she has lost 5 pounds since her last  visit.  She denies fever, chills or other signs of infection.  She denies nausea, vomiting, bowel issues, or abdominal pain.  She denies sore throat, cough, dyspnea, or chest pain.  Monica Poole is here for follow up prior to her next denosumab.  She continues Aromasin 25 mg daily and Afinitor 10 mg daily without significant difficulty.     Her  appetite is good, and she has gained/lost _ pounds since her last visit.  She denies fever, chills or other signs of infection.  She denies nausea, vomiting, bowel issues, or abdominal pain.  She denies sore throat, cough, dyspnea, or chest pain.  REVIEW OF SYSTEMS:  Review of Systems  Constitutional: Negative.   HENT:  Negative.   Eyes: Negative.   Respiratory: Negative.   Cardiovascular: Positive for leg swelling (feet and ankles mostly).  Gastrointestinal: Negative.   Endocrine: Negative.   Genitourinary: Negative.    Musculoskeletal: Positive for arthralgias (bilateral hips) and myalgias.  Skin: Negative.   Neurological: Negative.   Hematological: Negative.   Psychiatric/Behavioral: Negative.      VITALS:  There were no vitals taken for this visit.  Wt Readings  from Last 3 Encounters:  11/01/20 143 lb 12 oz (65.2 kg)  10/04/20 147 lb 1 oz (66.7 kg)  09/30/20 145 lb 11.2 oz (66.1 kg)    There is no height or weight on file to calculate BMI.  Performance status (ECOG): 1 - Symptomatic but completely ambulatory  PHYSICAL EXAM:  Physical Exam  LABS:   CBC Latest Ref Rng & Units 11/01/2020 09/30/2020 08/25/2020  WBC - 3.9 4.9 1.8  Hemoglobin 12.0 - 16.0 14.1 13.4 13.1  Hematocrit 36 - 46 42 39 39  Platelets 150 - 399 160 239 262   CMP Latest Ref Rng & Units 11/01/2020 09/30/2020 08/25/2020  Glucose 70 - 99 mg/dL - - -  BUN 4 - '21 11 10 7  ' Creatinine 0.5 - 1.1 0.7 0.7 0.8  Sodium 137 - 147 139 137 138  Potassium 3.4 - 5.3 4.0 3.8 2.9(A)  Chloride 99 - 108 105 105 101  CO2 13 - 22 28(A) 27(A) 32(A)  Calcium 8.7 - 10.7 9.0 9.6 8.3(A)   Total Protein 6.5 - 8.1 g/dL - - -  Total Bilirubin 0.3 - 1.2 mg/dL - - -  Alkaline Phos 25 - 125 102 67 70  AST 13 - 35 90(A) 49(A) 37(A)  ALT 7 - 35 67(A) 36(A) 24     STUDIES:  No results found.   Allergies:  Allergies  Allergen Reactions  . Bee Venom Anaphylaxis  . Dicyclomine Other (See Comments)    CARDIAC ARREST  Other reaction(s): Other (See Comments), Other (See Comments) [place]  . Latex Rash and Other (See Comments)    BLISTERS  Other reaction(s): Other (See Comments) BLISTERS  . Wasp Venom Protein Anaphylaxis  . Bentyl [Dicyclomine Hcl] Other (See Comments)    CARDIAC ARREST  . Other Other (See Comments) and Rash    Blisters, also- ONLY PAPER TAPE!!  . Tape Rash and Other (See Comments)    Blisters, also- ONLY PAPER TAPE!!    Current Medications: Current Outpatient Medications  Medication Sig Dispense Refill  . albuterol (ACCUNEB) 1.25 MG/3ML nebulizer solution Take 1 ampule by nebulization every 4 (four) hours as needed for wheezing.     Marland Kitchen albuterol (VENTOLIN HFA) 108 (90 Base) MCG/ACT inhaler Can inhale two puffs every four to six hours as needed for cough or wheeze. 8 g 1  . ALPRAZolam (XANAX) 0.5 MG tablet Take 1 tablet (0.5 mg total) by mouth 4 (four) times daily. 120 tablet 0  . budesonide (PULMICORT) 0.5 MG/2ML nebulizer solution Take 0.5 mg by nebulization 2 (two) times daily as needed (wheezing).    . calcium-vitamin D (OSCAL WITH D) 500-200 MG-UNIT tablet Take 1 tablet by mouth 2 (two) times daily.     Marland Kitchen denosumab (XGEVA) 120 MG/1.7ML SOLN injection Inject 120 mg into the skin once.    Marland Kitchen exemestane (AROMASIN) 25 MG tablet Take 1 tablet (25 mg total) by mouth daily after breakfast. 30 tablet 5  . fentaNYL (DURAGESIC) 100 MCG/HR Place 1 patch onto the skin every 3 (three) days. 5 patch 0  . fentaNYL (DURAGESIC) 50 MCG/HR SMARTSIG:50 MCG Topical Every 3 Days 5 patch 0  . Fluticasone-Umeclidin-Vilant (TRELEGY ELLIPTA)  200-62.5-25 MCG/INH AEPB Inhale 1 Dose into the lungs daily. Rinse, gargle, and spit after use. (Patient taking differently: Inhale 1 Dose into the lungs daily as needed. Rinse, gargle, and spit after use.) 60 each 5  . furosemide (LASIX) 20 MG tablet Take 1 tablet (20 mg total) by mouth  daily. 30 tablet 5  . ipratropium (ATROVENT) 0.06 % nasal spray Can use one to two sprays in each nostril every six hours as needed to dry up nose. 15 mL 5  . ipratropium-albuterol (DUONEB) 0.5-2.5 (3) MG/3ML SOLN Take 3 mLs by nebulization every 4 (four) hours as needed. 360 mL 1  . magic mouthwash w/lidocaine SOLN Take 5 mLs by mouth 4 (four) times daily as needed for mouth pain. 320 mL 3  . mometasone-formoterol (DULERA) 200-5 MCG/ACT AERO Inhale two puffs twice daily to prevent cough or wheeze.  Rinse, gargle, and spit after use. (Patient taking differently: Inhale 2 puffs into the lungs 2 (two) times daily as needed (for "flares" and rinse mouth afterwards). ) 1 Inhaler 5  . nitrofurantoin, macrocrystal-monohydrate, (MACROBID) 100 MG capsule Take 1 capsule (100 mg total) by mouth 2 (two) times daily. 14 capsule 1  . ondansetron (ZOFRAN) 4 MG tablet Take 1 tablet (4 mg total) by mouth every 8 (eight) hours as needed for nausea or vomiting. 20 tablet 0  . Oxycodone HCl 20 MG TABS Take 1 tablet (20 mg total) by mouth every 4 (four) hours as needed. Refilled on 09/30/20 180 tablet 0  . pantoprazole (PROTONIX) 40 MG tablet Take 1 tablet (40 mg total) by mouth 2 (two) times daily. 60 tablet 5  . phenazopyridine (PYRIDIUM) 100 MG tablet Take 1 tablet (100 mg total) by mouth 3 (three) times daily as needed for pain. 10 tablet 0  . Potassium Chloride ER 20 MEQ TBCR Take 1 tablet by mouth 2 (two) times daily.    . prochlorperazine (COMPAZINE) 10 MG tablet Take 1 tablet (10 mg total) by mouth every 8 (eight) hours as needed for nausea or vomiting. 30 tablet 1  . triamcinolone cream (KENALOG) 0.1 % Apply 1 application topically  as needed (to affected, irritated sites). Prn     . venlafaxine XR (EFFEXOR-XR) 150 MG 24 hr capsule Take 150 mg by mouth daily.    Marland Kitchen venlafaxine XR (EFFEXOR-XR) 75 MG 24 hr capsule TAKE 1 CAPSULE BY MOUTH DAILY ALONG WITH 150 MG 90 capsule 3  . zolpidem (AMBIEN) 10 MG tablet Take 1 tablet (10 mg total) by mouth at bedtime as needed for sleep. 30 tablet 1   No current facility-administered medications for this visit.     ASSESSMENT & PLAN:   Assessment:   1. Stage IIB right breast cancer diagnosed in June 2018.  This was treated at the [facility] in [place], [place] with neoadjuvant chemotherapy, and adjuvant radiation.  She underwent bilateral mastectomies in March 2019.   2. History of cervical cancer at a young age, treated with surgery and radiation.  3. 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  She was placed on palbociclib/fulvestrant, but only completed 4 months of therapy, then discontinued that on her own.  We assumed follow-up and restarted palbociclib/fulvestrant in November 2020, and started her on monthly denosumab, but she has missed some doses.  She received palliative radiation to the bilateral hips completed in December 2020.  Palbociclib was resumed in May 2021, and then placed on hold this summer due to poor healing of her reconstruction.  We resumed this at the end of June and now have stopped it as of the end of October due to progression of disease.  4. Depression, now on Effexor dose at 225 mg daily with fairly good control of her symptoms.  She still  lacks motivation at times.  5. Bilateral breast reconstruction, with implants placed on February 17th.  This was healing well, but had to be removed due to the skin splitting open.  I do feel a nodule in the lateral left reconstruction and a biopsy was scheduled.  In view of these findings, we will hold off for now, and monitor  this area.  6. Diagnosis of breast cancer at a very young age.  She did not have clinically significant mutation or variants of uncertain significance on [facility] myRisk done in July 2018.  Testing for PIK3CA was negative.  7. Lymphedema of the right upper extremity.    8. 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   9. Hypodense metastatic liver lesion of the left lobe, and increased, now measuring 3.2 x 2.4 cm.  Suspect subtle, new hypodense lesions were also observed on most recent CT imaging in October.  10. Mild hypocalcemia, resolved.  We will have her continue oral calcium 600 mg three times daily.  Plan: We discussed treatment planning today in view of recent progression of disease.  Options for oral therapy would include hormonal therapy with Aromasin as well as oral chemotherapy with Afinitor 10 mg daily.  Systemic options would include Taxotere, eribulin, Navelbine, gemcitabine or a combination of gemcitabine/Navelbine.  I reviewed the schedule and possible toxicities associated with these therapies.  The patient declines port placement due to previous complications, but is agreeable to a PICC line for systemic treatment.  Therefore we will proceed with Aromasin 25 mg daily and Afinitor 10 mg daily, and she is in agreement.  I will go ahead and send in the prescription for Aromasin 25 mg daily.  She will proceed with Delton See on January 31st.  I will send in refills for oxycodone 20 mg Q4H, pantoprazole 40 mg BID, and Lasix 20 mg prn.  We will see her back in 4 weeks with CBC and CMP prior to her next Xgeva.  The patient verbalizes understanding of an agreement to the plan today.  She knows to call the office should any new questions or concerns arise.   I provided 25 minutes of face-to-face time during this this encounter and > 50% was spent counseling as documented under my assessment and plan.    Derwood Kaplan, MD [facility] AT  [facility] [address] [place] [address] Dept: [phone] Dept Fax: [phone]   I, Rita Ohara, am acting as scribe for Derwood Kaplan, MD  I have reviewed this report as typed by the medical scribe, and it is complete and accurate.

## 2020-11-24 ENCOUNTER — Telehealth: Payer: Self-pay

## 2020-11-24 NOTE — Telephone Encounter (Signed)
  I called pt, notified her of below. She will get tested Tuesday, 11/30/20. Rescheduled appts to 12/20/20 for lab, f/u and 12/24/20 for injection.  Kelli, could you please ove her prders? Thanks!   Derwood Kaplan, MD  Dairl Ponder, RN; Marvia Pickles, PA-C; Dayton Scrape A, NP Yes, would need to cancel appts again. Sherilyn should be tested on day 5 or later,before she comes in        Previous Messages   ----- Message -----  From: Dairl Ponder, RN  Sent: 11/24/2020  3:33 PM EST  To: Derwood Kaplan, MD, Melodye Ped, NP, *  Subject: Appts tomorrow                  I sent this to all of you as an update, as we were discussing in supportive care meeting yesterday morning.   Mayumi has called to make [place] aware that her daughter woke up this morning with temp 102 w/ eyes & nose running. She has went for COVID testing, but unlikely to get results until later tomorrow. She did see her daughter last night. Her question is does she still come to appt's? I know she will need to wait to come in. Also, shouldn't Leanor be tested on day 5?   [phone]

## 2020-11-25 ENCOUNTER — Inpatient Hospital Stay: Payer: Medicare HMO

## 2020-11-25 ENCOUNTER — Inpatient Hospital Stay: Payer: Medicare HMO | Admitting: Oncology

## 2020-11-29 ENCOUNTER — Inpatient Hospital Stay: Payer: Medicare HMO

## 2020-12-03 ENCOUNTER — Other Ambulatory Visit: Payer: Self-pay | Admitting: Oncology

## 2020-12-03 ENCOUNTER — Other Ambulatory Visit: Payer: Self-pay

## 2020-12-03 DIAGNOSIS — C7951 Secondary malignant neoplasm of bone: Secondary | ICD-10-CM

## 2020-12-03 DIAGNOSIS — G4700 Insomnia, unspecified: Secondary | ICD-10-CM

## 2020-12-03 DIAGNOSIS — Z170 Estrogen receptor positive status [ER+]: Secondary | ICD-10-CM

## 2020-12-03 DIAGNOSIS — C50411 Malignant neoplasm of upper-outer quadrant of right female breast: Secondary | ICD-10-CM

## 2020-12-03 MED ORDER — OXYCODONE HCL 20 MG PO TABS: 1.0000 | ORAL_TABLET | ORAL | 0 refills | Status: DC | PRN

## 2020-12-03 MED ORDER — FENTANYL 50 MCG/HR TD PT72: 1.0000 | MEDICATED_PATCH | TRANSDERMAL | 0 refills | Status: AC

## 2020-12-03 MED ORDER — FENTANYL 100 MCG/HR TD PT72: 1.0000 | MEDICATED_PATCH | TRANSDERMAL | 0 refills | Status: DC

## 2020-12-03 MED ORDER — ZOLPIDEM TARTRATE 10 MG PO TABS: 10.0000 mg | ORAL_TABLET | Freq: Every evening | ORAL | 1 refills | Status: DC | PRN

## 2020-12-06 ENCOUNTER — Telehealth: Payer: Self-pay

## 2020-12-06 NOTE — Telephone Encounter (Signed)
I called pt to see how she is tolerating the Afinitor. Pt restarted the Afinitor on 11/26/20. She is taking the medication @ midnight with some jello or pudding. She reports mouth sores and N/V within couple hours of taking it. Denies missed doses.  Pt is using the dexamethasone rinse "5 times per day, but they aren't drying up. I use the magic mouthwash too". I asked Corin if she was using the nausea medications prior to taking the Afinitor. She replied, "no". I encouraged her to start taking a nausea medication 30 min to 1 hour before taking the Afinitor to hopefully minimize the N/V. It is important for her to keep the Afinitor down. I instructed her if the nausea starts rising again a couple hours after the Afinitor, to take the other nausea medication, and alternate PRN.   Berdina verbalizes understanding of above information. Confirmed next appts while on phone as well.  Pt states she hasnt ever gotten the results from her COVID test.

## 2020-12-09 ENCOUNTER — Other Ambulatory Visit: Payer: Self-pay | Admitting: Hematology and Oncology

## 2020-12-09 ENCOUNTER — Telehealth: Payer: Self-pay

## 2020-12-09 DIAGNOSIS — C50411 Malignant neoplasm of upper-outer quadrant of right female breast: Secondary | ICD-10-CM

## 2020-12-09 DIAGNOSIS — C7951 Secondary malignant neoplasm of bone: Secondary | ICD-10-CM

## 2020-12-09 DIAGNOSIS — Z170 Estrogen receptor positive status [ER+]: Secondary | ICD-10-CM

## 2020-12-09 MED ORDER — EVEROLIMUS 5 MG PO TABS: 5.0000 mg | ORAL_TABLET | Freq: Every day | ORAL | 2 refills | Status: DC

## 2020-12-09 MED ORDER — PREDNISONE 5 MG PO TABS: 5.0000 mg | ORAL_TABLET | Freq: Two times a day (BID) | ORAL | 0 refills | Status: DC

## 2020-12-09 MED ORDER — PREDNISONE 20 MG PO TABS: ORAL_TABLET | ORAL | 0 refills | Status: DC

## 2020-12-09 NOTE — Telephone Encounter (Addendum)
@ 1613- I called pt back, and notified her of below. She will stop taking the Afinitor until she is seen back in the office. I transferred the call to [facility] afterwards to help explain things better.   Mosher, Beryle Flock, RN; Derwood Kaplan, MD; Melodye Ped, NP Per Vida Roller - Recommend decreasing Affinitor. I will send in a prescription for Affinitor 29m daily to [facility]. I would like her to see a counselor for depression and coping. She has recurrent metastatic cancer, so not staged. This is not curable, but may be controlled with treatment. Do I need to talk to her?    ----- Message from CDerwood Kaplan MD sent at 12/09/2020  3:55 PM EST ----- Regarding: RE: Afinitor She has stage IV disease, met to bone and now liver.  I rec. She hold the afinitor for now and let's work on getting her the 561mstrength for when she starts back.  If unable to tolerate or doesn't work, we still have IV chemo but she would need to come in regularly, has missed a lot of appts. We can order prednisone 20 bid x 5 days, then qd x 5 days. ----- Message ----- From: CaDairl PonderRN Sent: 12/09/2020   3:50 PM EST To: ChDerwood KaplanMD, MeMelodye PedNP, # Subject: RE: Afinitor                                   I called pt to get an update. She states she really cant eat or chew anything, other than really soft things like puddings. I definitely know I have lost weight, but I was overweight anyway". I explained to her that we don't want her losing weight, esp loss of lean muscle. Pt denies using supplements like ensures or carnation breakfast. States, "I just cant do it". She does try to drink some Gatorades. She states she is taking the Zofran before the Afinitor, but she still has to take the compazine a little while after. The mouth ulcers maybe a tiny bit better, but she wants to know if Dr McHinton Raoould call her in a 3-5d dose of prednisone. The prednisone helps her  with pain and helps the sores to dry up. She mentions that, "I'm super depressed. I don't know if she wants to do anything different with my meds or not. I know we haven't changed anything in a while. This is around the time when my mom died a year ago, and its just hard. I also want to know if I am terminal or am I like a Stage 3.5"?  Pt is willing to decrease the Afinitor if recommended.    ----- Message ----- From: PaMelodye PedNP Sent: 12/09/2020   2:54 PM EST To: AmDairl PonderRN Subject: RE: Afinitor                                   I have not  ----- Message ----- From: CaDairl PonderRN Sent: 12/09/2020   2:35 PM EST To: MeMelodye PedNP Subject: RE: Afinitor                                   By chance have you  reached out to [facility] (see below)? ----- Message ----- From: Derwood Kaplan, MD Sent: 12/06/2020   5:57 PM EST To: Melodye Ped, NP, Sanjay Broadfoot Cherylann Banas, RN, # Subject: RE: Lenon Oms                                   If she keeps having severe tox, may need to hold and reduce dose, prob to 5 mg daily and then see if we can push it up to 7.5 mg.  Would one of you call her this week again? ----- Message ----- From: Dairl Ponder, RN Sent: 12/06/2020   2:02 PM EST To: Derwood Kaplan, MD, Melodye Ped, NP, # Subject: Afinitor                                       I called pt to see how she is tolerating the Afinitor. Pt restarted the Afinitor on 11/26/20. She is taking the medication @ midnight with some jello or pudding. She reports mouth sores and N/V within couple hours of taking it. Denies missed doses.  Pt is using the dexamethasone rinse "5 times per day, but they aren't drying up. I use the magic mouthwash too". I asked Olivene if she was using the nausea medications prior to taking the Afinitor. She replied, "no". I encouraged her to start taking a nausea medication 30 min to 1 hour before taking the Afinitor to hopefully minimize  the N/V. It is important for her to keep the Afinitor down. I instructed her if the nausea starts rising again a couple hours after the Afinitor, to take the other nausea medication, and alternate PRN.  Sharone verbalizes understanding of above information. Confirmed next appts while on phone as well.    Just FYI

## 2020-12-13 ENCOUNTER — Other Ambulatory Visit: Payer: Self-pay

## 2020-12-13 DIAGNOSIS — I890 Lymphedema, not elsewhere classified: Secondary | ICD-10-CM | POA: Insufficient documentation

## 2020-12-13 DIAGNOSIS — C7951 Secondary malignant neoplasm of bone: Secondary | ICD-10-CM

## 2020-12-13 DIAGNOSIS — F411 Generalized anxiety disorder: Secondary | ICD-10-CM

## 2020-12-13 DIAGNOSIS — F32A Depression, unspecified: Secondary | ICD-10-CM

## 2020-12-13 DIAGNOSIS — C50411 Malignant neoplasm of upper-outer quadrant of right female breast: Secondary | ICD-10-CM

## 2020-12-13 DIAGNOSIS — Z170 Estrogen receptor positive status [ER+]: Secondary | ICD-10-CM

## 2020-12-13 MED ORDER — ALPRAZOLAM 0.5 MG PO TABS: 0.5000 mg | ORAL_TABLET | Freq: Four times a day (QID) | ORAL | 1 refills | Status: DC

## 2020-12-13 MED ORDER — VENLAFAXINE HCL ER 150 MG PO CP24: 150.0000 mg | ORAL_CAPSULE | Freq: Every day | ORAL | 1 refills | Status: DC

## 2020-12-16 ENCOUNTER — Other Ambulatory Visit: Payer: Self-pay | Admitting: Hematology and Oncology

## 2020-12-16 ENCOUNTER — Telehealth: Payer: Self-pay

## 2020-12-16 NOTE — Telephone Encounter (Addendum)
I spoke with pt, she verbalized understanding. Confirmed next appt for Monday, 2/21.    ----- Message from Derwood Kaplan, MD sent at 12/16/2020  3:21 PM EST ----- Regarding: RE: feet & ankle edema Okay to take some Lasix as needed, just not too much, put up with some edema ----- Message ----- From: Dairl Ponder, RN Sent: 12/16/2020   2:21 PM EST To: Derwood Kaplan, MD Subject: feet & ankle edema                             Pt called to report that the edema in her feet and ankles has returned. She wants to know if you want her to take some lasix, or just wait until she sees you on Monday?? [phone]

## 2020-12-16 NOTE — Progress Notes (Signed)
[facility]  [address] [place],  [place]  [address] [phone]  Clinic Day:  12/20/2020  Referring physician: Bonnita Nasuti, MD   This document serves as a record of services personally performed by Hosie Poisson, MD. It was created on their behalf by Curry,Lauren E, a trained medical scribe. The creation of this record is based on the scribe's personal observations and the provider's statements to them.   CHIEF COMPLAINT:  CC: Metastatic breast cancer  Current Treatment:  Will plan for Afinitor 10 mg daily   HISTORY OF PRESENT ILLNESS:  Monica Poole is a 45 y.o. female with a history of stage IIB right breast cancer diagnosed in June 2018.  Estrogen and progesterone receptors were positive at 95% and HER2 negative.  Ki67 was 70%.  She was seen at [facility] in July 2018 for consultation, but did not pursue treatment with [place] at that time.  Testing for hereditary breast and ovarian cancer with the [facility]riad [facility] Hereditary Cancer panel test in July 2018 did not reveal any clinically significant mutation or variants of uncertain significance.  She was going to go to [facility] for a second opinion, but did not pursue this, and we did not hear back from her regarding her cancer.  Her port was removed in 2018 due to septic infection.  She went to [facility] in [place] and was placed on neoadjuvant chemotherapy for 8 months, but with limited response.  In March 2019, she underwent bilateral mastectomies, but had complications with her reconstruction.  She had expanders placed, but never had the permanent implants inserted.  She received adjuvant radiation.  She was then placed on letrozole, but only completed 6 months.  She started having pain in her right hip in February 2020.  Hip x-rays in April revealed multifocal sclerotic bony metastases.  She then underwent a biopsy of the left anterior iliac bone in May,  which confirmed metastatic carcinoma favoring breast primary.  She was placed on palbociclib/fulvestrant, but only completed 4 months.  These were stopped due to problems with insurance and transportation to [place].    We began seeing her again in November 2020 to manage her widespread bone metastases.  CT chest, abdomen, and pelvis in November revealed innumerable small sclerotic metastases throughout the axial and proximal appendicular skeleton, increased in size, number and degree of sclerosis, favoring progression of osseous metastatic disease.  No lymphadenopathy or other findings of extraosseous metastatic disease in the chest, abdomen, or pelvis.  She also underwent a nuclear medicine whole body bone scan, which revealed osseous metastases at left iliac bone, thoracic spine, and bilateral proximal femora.  We resumed palbociclib/fulvestrant in November.  She received palliative radiation to the bilateral hips completed in December.  She was referred to plastic surgeon regarding her expanders.  She had palmar erythrodysesthesia and was given information on that diagnosis and how to manage it.  Her pain medications were titrated and she did require higher doses of prednisone temporarily.  CT chest, abdomen and pelvis from May 3rd revealed development of a segment 3 hypoattenuating liver lesion, measuring 1.8 cm, concerning for new metastasis.  No other evidence of soft tissue metastasis within the chest, abdomen or pelvis.  Osseous metastasis are primarily similar.  New and progressive areas of sclerosis involving the T6 and S1 vertebral bodies were seen.  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  We have  steadily tapered her prednisone dose down.  Her calcium dose was increased to 600 mg TID.    Left diagnostic mammogram and left breast ultrasound from October 7th revealed an indeterminate 1.2 cm mass involving the lower outer quadrant of the reconstructed left  breast at the inframammary fold.  This may represent focal post surgical dense fibrosis or fat necrosis, though an oil cyst could not be confirmed on the spot tangential mammogram.  Biopsy was recommended and scheduled, but she missed this appointment.  CT chest, abdomen and pelvis from October 7th revealed interval increase in the size of a hypodense metastatic lesion of the left lobe of the liver measuring 3.2 x 2.4 cm, previously 2.1 x 2.0 cm.  Suspect subtle, new hypodense lesions of the superior left lobe of the liver anddjacent liver dome, and interval increase in multiple sclerotic osseous lesions throughout the vertebral bodies, sternum and pelvis.  Overall constellation of findings were consistent with worsened metastatic disease and so her fulvestrant and palbociclib were stopped.  She was scheduled with her dentist on October 28th for a broken tooth, so we held denosumab.  Her white count had decreased from 16.5 to 1.8 with an [place] of 850 from her palbociclib.  She is now on fentanyl at 150 mcg every 3 days.  She was placed on Lasix to use as needed for edema.  When we met at the end of October we discussed the other options of chemotherapy.  She wanted some time to make her decisions.  INTERVAL HISTORY:  Monica Poole is here for routine follow up prior to her next denosumab, which has had to be rescheduled multiple times.  She started treatment with Aromasin 25 mg daily in December.  She was also started on Afinitor 10 mg daily, but had severe mouth sores, anorexia, and was unable to eat.  It was stopped after 2 weeks and she has been on a break for nearly 1 month, and has finally received the Afinitor 5 mg last weekend.  As her lab work looks good, she can start this now.  She notes a red, pruritic lesion of the central upper chest, which is nonspecific.  She has been using cortisone cream with partial response.  I advised that she try a Nystatin cream to see if this improves.  She has required Lasix 20 mg  daily for the past week, and has had improvement in her lower extremity edema.  She also notes a runny nose.  She continues to have right hip pain which she rates as a 5/10 today.  She continues to report insomnia for which she uses Ambien 15 mg at bedtime.  Blood counts are unremarkable.   Her  appetite is fair, and she has lost 2 pounds since her last visit.  She denies fever, chills or other signs of infection.  She denies nausea, vomiting, bowel issues, or abdominal pain.  She denies sore throat, cough, dyspnea, or chest pain.   REVIEW OF SYSTEMS:  Review of Systems  Constitutional: Positive for appetite change (improved). Negative for chills, fatigue, fever and unexpected weight change.  HENT:  Negative.   Eyes: Negative.   Respiratory: Negative.  Negative for chest tightness, cough, hemoptysis, shortness of breath and wheezing.   Cardiovascular: Negative.  Negative for chest pain, leg swelling and palpitations.  Gastrointestinal: Negative for abdominal distention, abdominal pain, blood in stool, constipation, diarrhea, nausea and vomiting.  Endocrine: Negative.   Genitourinary: Negative.  Negative for difficulty urinating, dysuria, frequency and hematuria.  Musculoskeletal: Positive for arthralgias and myalgias. Negative for gait problem.       Right hip pain  Skin:       Red, pruritic lesion of the left upper chest, which is nonspecific.  Neurological: Negative.  Negative for dizziness, extremity weakness, gait problem, headaches, light-headedness, numbness, seizures and speech difficulty.  Hematological: Negative.   Psychiatric/Behavioral: Positive for sleep disturbance (insomnia). Negative for depression. The patient is not nervous/anxious.      VITALS:  Blood pressure (!) 145/89, pulse 68, temperature 98.5 F (36.9 C), temperature source Oral, resp. rate 18, height '5\' 1"'  (1.549 m), weight 143 lb 14.4 oz (65.3 kg), SpO2 97 %.  Wt Readings from Last 3 Encounters:  12/20/20 143 lb  14.4 oz (65.3 kg)  11/01/20 143 lb 12 oz (65.2 kg)  10/04/20 147 lb 1 oz (66.7 kg)    Body mass index is 27.19 kg/m.  Performance status (ECOG): 1 - Symptomatic but completely ambulatory  PHYSICAL EXAM:  Physical Exam Constitutional:      General: She is not in acute distress.    Appearance: Normal appearance. She is normal weight.  HENT:     Head: Normocephalic and atraumatic.  Eyes:     General: No scleral icterus.    Extraocular Movements: Extraocular movements intact.     Conjunctiva/sclera: Conjunctivae normal.     Pupils: Pupils are equal, round, and reactive to light.  Cardiovascular:     Rate and Rhythm: Normal rate and regular rhythm.     Pulses: Normal pulses.     Heart sounds: Normal heart sounds. No murmur heard. No friction rub. No gallop.   Pulmonary:     Effort: Pulmonary effort is normal. No respiratory distress.     Breath sounds: Normal breath sounds.  Chest:  Breasts:     Right: Absent.      Comments: Right mastectomy is negative.  She has a 1 cm nodule in the lower outer quadrant of the left reconstruction, which is stable.  She has a scaly erythematous slightly raised lesion in the central upper chest, which could be a keratosis since it is very pruritic, we will try antifungal cream Abdominal:     General: Bowel sounds are normal. There is no distension.     Palpations: Abdomen is soft. There is no hepatomegaly, splenomegaly or mass.     Tenderness: There is no abdominal tenderness.  Musculoskeletal:        General: Normal range of motion.     Cervical back: Normal range of motion and neck supple.     Right lower leg: Edema (mild) present.     Left lower leg: Edema (mild) present.  Lymphadenopathy:     Cervical: No cervical adenopathy.  Skin:    General: Skin is warm and dry.  Neurological:     General: No focal deficit present.     Mental Status: She is alert and oriented to person, place, and time. Mental status is at baseline.  Psychiatric:         Mood and Affect: Mood normal.        Behavior: Behavior normal.        Thought Content: Thought content normal.        Judgment: Judgment normal.   .    LABS:   CBC Latest Ref Rng & Units 11/01/2020 09/30/2020 08/25/2020  WBC - 3.9 4.9 1.8  Hemoglobin 12.0 - 16.0 14.1 13.4 13.1  Hematocrit 36 - 46 42 39 39  Platelets  150 - 399 160 239 262   CMP Latest Ref Rng & Units 11/01/2020 09/30/2020 08/25/2020  Glucose 70 - 99 mg/dL - - -  BUN 4 - '21 11 10 7  ' Creatinine 0.5 - 1.1 0.7 0.7 0.8  Sodium 137 - 147 139 137 138  Potassium 3.4 - 5.3 4.0 3.8 2.9(A)  Chloride 99 - 108 105 105 101  CO2 13 - 22 28(A) 27(A) 32(A)  Calcium 8.7 - 10.7 9.0 9.6 8.3(A)  Total Protein 6.5 - 8.1 g/dL - - -  Total Bilirubin 0.3 - 1.2 mg/dL - - -  Alkaline Phos 25 - 125 102 67 70  AST 13 - 35 90(A) 49(A) 37(A)  ALT 7 - 35 67(A) 36(A) 24     STUDIES:  No results found.   Allergies:  Allergies  Allergen Reactions  . Bee Venom Anaphylaxis  . Dicyclomine Other (See Comments)    CARDIAC ARREST  Other reaction(s): Other (See Comments), Other (See Comments) CARDIAC ARREST CARDIAC ARREST Other reaction(s): cardiac arrest per Pt  . Latex Rash and Other (See Comments)    BLISTERS  Other reaction(s): Other (See Comments) BLISTERS  . Wasp Venom Protein Anaphylaxis  . Bentyl [Dicyclomine Hcl] Other (See Comments)    CARDIAC ARREST  . Other Other (See Comments) and Rash    Blisters, also- ONLY PAPER TAPE!! Other reaction(s): Unknown  . Tape Rash and Other (See Comments)    Blisters, also- ONLY PAPER TAPE!!    Current Medications: Current Outpatient Medications  Medication Sig Dispense Refill  . AFINITOR 10 MG tablet Take 10 mg by mouth daily.    Marland Kitchen albuterol (ACCUNEB) 1.25 MG/3ML nebulizer solution Take 1 ampule by nebulization every 4 (four) hours as needed for wheezing.     Marland Kitchen albuterol (VENTOLIN HFA) 108 (90 Base) MCG/ACT inhaler Can inhale two puffs every four to six hours as needed for cough or  wheeze. 8 g 1  . ALPRAZolam (XANAX) 0.5 MG tablet Take 1 tablet (0.5 mg total) by mouth 4 (four) times daily. 120 tablet 1  . budesonide (PULMICORT) 0.5 MG/2ML nebulizer solution Take 0.5 mg by nebulization 2 (two) times daily as needed (wheezing).    . calcium-vitamin D (OSCAL WITH D) 500-200 MG-UNIT tablet Take 1 tablet by mouth 2 (two) times daily.     . cetirizine (ZYRTEC) 10 MG tablet 1 tablet    . clobetasol ointment (TEMOVATE) 7.49 % 1 application to affected area    . denosumab (XGEVA) 120 MG/1.7ML SOLN injection Inject 120 mg into the skin once.    Marland Kitchen dexamethasone (DECADRON) 0.5 MG/5ML solution Take 1 mg by mouth 4 (four) times daily.    Marland Kitchen everolimus (AFINITOR) 5 MG tablet Take 1 tablet (5 mg total) by mouth daily. 30 tablet 2  . exemestane (AROMASIN) 25 MG tablet Take 1 tablet (25 mg total) by mouth daily after breakfast. 30 tablet 5  . fentaNYL (DURAGESIC) 100 MCG/HR Place 1 patch onto the skin every 3 (three) days. 10 patch 0  . fentaNYL (DURAGESIC) 50 MCG/HR Place 1 patch onto the skin every 3 (three) days. 10 patch 0  . fluocinonide ointment (LIDEX) 0.05 % See admin instructions.    . Fluticasone-Umeclidin-Vilant (TRELEGY ELLIPTA) 200-62.5-25 MCG/INH AEPB Inhale 1 Dose into the lungs daily. Rinse, gargle, and spit after use. (Patient taking differently: Inhale 1 Dose into the lungs daily as needed. Rinse, gargle, and spit after use.) 60 each 5  . furosemide (LASIX) 20 MG tablet Take 1 tablet (  20 mg total) by mouth daily. 30 tablet 5  . ipratropium (ATROVENT) 0.06 % nasal spray Can use one to two sprays in each nostril every six hours as needed to dry up nose. 15 mL 5  . ipratropium-albuterol (DUONEB) 0.5-2.5 (3) MG/3ML SOLN Take 3 mLs by nebulization every 4 (four) hours as needed. 360 mL 1  . magic mouthwash w/lidocaine SOLN Take 5 mLs by mouth 4 (four) times daily as needed for mouth pain. 320 mL 3  . mometasone-formoterol (DULERA) 200-5 MCG/ACT AERO Inhale two puffs twice daily to  prevent cough or wheeze.  Rinse, gargle, and spit after use. (Patient taking differently: Inhale 2 puffs into the lungs 2 (two) times daily as needed (for "flares" and rinse mouth afterwards). ) 1 Inhaler 5  . nitrofurantoin, macrocrystal-monohydrate, (MACROBID) 100 MG capsule Take 1 capsule (100 mg total) by mouth 2 (two) times daily. 14 capsule 1  . ondansetron (ZOFRAN) 4 MG tablet Take 1 tablet (4 mg total) by mouth every 8 (eight) hours as needed for nausea or vomiting. 20 tablet 0  . Oxycodone HCl 20 MG TABS Take 1 tablet (20 mg total) by mouth every 4 (four) hours as needed. Refilled on 09/30/20 180 tablet 0  . pantoprazole (PROTONIX) 40 MG tablet Take 1 tablet (40 mg total) by mouth 2 (two) times daily. 60 tablet 5  . phenazopyridine (PYRIDIUM) 100 MG tablet Take 1 tablet (100 mg total) by mouth 3 (three) times daily as needed for pain. 10 tablet 0  . Potassium Chloride ER 20 MEQ TBCR Take 1 tablet by mouth 2 (two) times daily.    . predniSONE (DELTASONE) 5 MG tablet Take by mouth 3 (three) times daily.    . prochlorperazine (COMPAZINE) 10 MG tablet Take 1 tablet (10 mg total) by mouth every 8 (eight) hours as needed for nausea or vomiting. 30 tablet 1  . triamcinolone cream (KENALOG) 0.1 % Apply 1 application topically as needed (to affected, irritated sites). Prn     . venlafaxine XR (EFFEXOR-XR) 150 MG 24 hr capsule Take 1 capsule (150 mg total) by mouth daily. 90 capsule 1  . venlafaxine XR (EFFEXOR-XR) 75 MG 24 hr capsule TAKE 1 CAPSULE BY MOUTH DAILY ALONG WITH 150 MG 90 capsule 3  . zolpidem (AMBIEN) 5 MG tablet Take 5-10 mg by mouth at bedtime as needed. PATIENT STATES SHE HAS BEEN TAKING 1.5 TABLETS OF THE 10MG TABLET     No current facility-administered medications for this visit.     ASSESSMENT & PLAN:   Assessment:   1. Stage IIB right breast cancer diagnosed in June 2018.  This was treated at the [facility] in [place], [place] with neoadjuvant  chemotherapy, and adjuvant radiation.  She underwent bilateral mastectomies in March 2019.   2. History of cervical cancer at a young age, treated with surgery and radiation.  3. 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  She was placed on palbociclib/fulvestrant, but only completed 4 months of therapy, then discontinued that on her own.  We assumed follow-up and restarted palbociclib/fulvestrant in November 2020, and started her on monthly denosumab, but she has missed some doses.  She received palliative radiation to the bilateral hips completed in December 2020.  Palbociclib was resumed in May 2021, and then placed on hold this summer due to poor healing of her reconstruction.  We resumed this at the end of June and now have stopped it  as of the end of October due to progression of disease.  We have started Exemestane and Evorilimus but she has had some interruptions due to toxicities.    4. Depression, now on Effexor dose at 225 mg daily with fairly good control of her symptoms.  She still lacks motivation at times.  5. Bilateral breast reconstruction, with implants placed on February 17th.  This was healing well, but the right side had to be removed due to the skin splitting open.  I do feel a nodule in the lateral left reconstruction and we will monitor this area.  6. Diagnosis of breast cancer at a very young age.  She did not have clinically significant mutation or variants of uncertain significance on Myriad myRisk done in July 2018.  Testing for PIK3CA was negative.  7. Lymphedema of the right upper extremity.    8. 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   9. Hypodense metastatic liver lesion of the left lobe, and increased, now measuring 3.2 x 2.4 cm.  Suspect subtle, new hypodense lesions were also observed on most recent CT imaging in October.  10. Mild hypocalcemia, resolved.  We will have her continue  oral calcium 600 mg three times daily.  11.  Pedal edema, which has resumed again, and so she has been using Lasix 20 mg daily.  Since it is mild today, I suggested that she go to every other day.  12.  Small scaly lesion of the central chest, which may be a keratosis or fungal infection.  We will see if it responds to Nystatin cream.  Plan: She started treatment with Aromasin 25 mg in December and she may start Afinitor 5 mg daily as she has finally received this.  She will proceed with Delton See on February 24th.   Her pain is well controlled on her current regimen, which she will continue.  She has been having worsening insomnia and using 1 and 1/2 of the 10 mg Ambien tablets at bedtime.  I will send in a refill for Ambien 10 mg, to take 1 and 1/2 tablets today.  We will see her back in 4 weeks with CBC and CMP prior to her next Xgeva.  Likely we will repeat CT imaging in June since there were several interruptions of her therapy.  The patient verbalizes understanding of an agreement to the plan today.  She knows to call the office should any new questions or concerns arise.   I provided 25 minutes of face-to-face time during this this encounter and > 50% was spent counseling as documented under my assessment and plan.    Derwood Kaplan, MD [facility] [address] [place] [address] Dept: [phone] Dept Fax: [phone]   I, Rita Ohara, am acting as scribe for Derwood Kaplan, MD  I have reviewed this report as typed by the medical scribe, and it is complete and accurate.

## 2020-12-20 ENCOUNTER — Inpatient Hospital Stay: Payer: Medicare Other

## 2020-12-20 ENCOUNTER — Telehealth: Payer: Self-pay | Admitting: Oncology

## 2020-12-20 ENCOUNTER — Other Ambulatory Visit: Payer: Self-pay | Admitting: Oncology

## 2020-12-20 ENCOUNTER — Other Ambulatory Visit: Payer: Self-pay | Admitting: Hematology and Oncology

## 2020-12-20 ENCOUNTER — Inpatient Hospital Stay: Payer: Medicare Other | Attending: Oncology | Admitting: Oncology

## 2020-12-20 ENCOUNTER — Encounter: Payer: Self-pay | Admitting: Oncology

## 2020-12-20 VITALS — BP 145/89 | HR 68 | Temp 98.5°F | Resp 18 | Ht 61.0 in | Wt 143.9 lb

## 2020-12-20 DIAGNOSIS — Z170 Estrogen receptor positive status [ER+]: Secondary | ICD-10-CM | POA: Diagnosis not present

## 2020-12-20 DIAGNOSIS — Z9221 Personal history of antineoplastic chemotherapy: Secondary | ICD-10-CM | POA: Insufficient documentation

## 2020-12-20 DIAGNOSIS — F32A Depression, unspecified: Secondary | ICD-10-CM | POA: Insufficient documentation

## 2020-12-20 DIAGNOSIS — C50411 Malignant neoplasm of upper-outer quadrant of right female breast: Secondary | ICD-10-CM

## 2020-12-20 DIAGNOSIS — C7951 Secondary malignant neoplasm of bone: Secondary | ICD-10-CM | POA: Insufficient documentation

## 2020-12-20 DIAGNOSIS — Z923 Personal history of irradiation: Secondary | ICD-10-CM | POA: Insufficient documentation

## 2020-12-20 DIAGNOSIS — Z79811 Long term (current) use of aromatase inhibitors: Secondary | ICD-10-CM | POA: Insufficient documentation

## 2020-12-20 DIAGNOSIS — Z79899 Other long term (current) drug therapy: Secondary | ICD-10-CM | POA: Insufficient documentation

## 2020-12-20 DIAGNOSIS — Z8541 Personal history of malignant neoplasm of cervix uteri: Secondary | ICD-10-CM | POA: Insufficient documentation

## 2020-12-20 DIAGNOSIS — C50911 Malignant neoplasm of unspecified site of right female breast: Secondary | ICD-10-CM | POA: Insufficient documentation

## 2020-12-20 LAB — BASIC METABOLIC PANEL
BUN: 14 (ref 4–21)
CO2: 29 — AB (ref 13–22)
Chloride: 102 (ref 99–108)
Creatinine: 0.8 (ref 0.5–1.1)
Glucose: 127
Potassium: 4.6 (ref 3.4–5.3)
Sodium: 136 — AB (ref 137–147)

## 2020-12-20 LAB — HEPATIC FUNCTION PANEL
ALT: 34 (ref 7–35)
AST: 55 — AB (ref 13–35)
Alkaline Phosphatase: 79 (ref 25–125)
Bilirubin, Total: 0.4

## 2020-12-20 LAB — COMPREHENSIVE METABOLIC PANEL
Albumin: 4 (ref 3.5–5.0)
Calcium: 9.5 (ref 8.7–10.7)

## 2020-12-20 LAB — CBC AND DIFFERENTIAL
HCT: 43 (ref 36–46)
Hemoglobin: 14.5 (ref 12.0–16.0)
Neutrophils Absolute: 4.26
Platelets: 183 (ref 150–399)
WBC: 4.9

## 2020-12-20 LAB — CBC: RBC: 4.97 (ref 3.87–5.11)

## 2020-12-20 MED ORDER — ZOLPIDEM TARTRATE 10 MG PO TABS: 15.0000 mg | ORAL_TABLET | Freq: Every day | ORAL | 1 refills | Status: DC

## 2020-12-20 NOTE — Telephone Encounter (Signed)
Per 2/21 LOS, patient scheduled for March Appt's including XGeva Injection.  Gave patient Appt Summary

## 2020-12-24 ENCOUNTER — Inpatient Hospital Stay: Payer: Medicare Other

## 2020-12-24 ENCOUNTER — Other Ambulatory Visit: Payer: Self-pay

## 2020-12-24 VITALS — BP 149/80 | HR 90 | Temp 98.4°F | Resp 16 | Ht 61.0 in | Wt 140.2 lb

## 2020-12-24 DIAGNOSIS — Z79899 Other long term (current) drug therapy: Secondary | ICD-10-CM | POA: Diagnosis not present

## 2020-12-24 DIAGNOSIS — F32A Depression, unspecified: Secondary | ICD-10-CM | POA: Diagnosis not present

## 2020-12-24 DIAGNOSIS — C50911 Malignant neoplasm of unspecified site of right female breast: Secondary | ICD-10-CM | POA: Diagnosis present

## 2020-12-24 DIAGNOSIS — Z8541 Personal history of malignant neoplasm of cervix uteri: Secondary | ICD-10-CM | POA: Diagnosis not present

## 2020-12-24 DIAGNOSIS — Z923 Personal history of irradiation: Secondary | ICD-10-CM | POA: Diagnosis not present

## 2020-12-24 DIAGNOSIS — Z79811 Long term (current) use of aromatase inhibitors: Secondary | ICD-10-CM | POA: Diagnosis not present

## 2020-12-24 DIAGNOSIS — Z9221 Personal history of antineoplastic chemotherapy: Secondary | ICD-10-CM | POA: Diagnosis not present

## 2020-12-24 DIAGNOSIS — R609 Edema, unspecified: Secondary | ICD-10-CM

## 2020-12-24 DIAGNOSIS — C7951 Secondary malignant neoplasm of bone: Secondary | ICD-10-CM | POA: Diagnosis not present

## 2020-12-24 DIAGNOSIS — Z170 Estrogen receptor positive status [ER+]: Secondary | ICD-10-CM | POA: Diagnosis not present

## 2020-12-24 MED ORDER — DENOSUMAB 120 MG/1.7ML ~~LOC~~ SOLN
120.0000 mg | Freq: Once | SUBCUTANEOUS | Status: AC
  Administered 2020-12-24: 120 mg via SUBCUTANEOUS

## 2020-12-24 MED ORDER — DENOSUMAB 120 MG/1.7ML ~~LOC~~ SOLN
SUBCUTANEOUS | Status: AC
  Filled 2020-12-24: qty 1.7

## 2020-12-24 MED ORDER — CALCIUM 600 MG PO TABS: 600.0000 mg | ORAL_TABLET | Freq: Three times a day (TID) | ORAL | 0 refills | Status: AC

## 2020-12-24 NOTE — Patient Instructions (Signed)
Denosumab injection What is this medicine? DENOSUMAB (den oh sue mab) slows bone breakdown. Prolia is used to treat osteoporosis in women after menopause and in men, and in people who are taking corticosteroids for 6 months or more. Xgeva is used to treat a high calcium level due to cancer and to prevent bone fractures and other bone problems caused by multiple myeloma or cancer bone metastases. Xgeva is also used to treat giant cell tumor of the bone. This medicine may be used for other purposes; ask your health care provider or pharmacist if you have questions. COMMON BRAND NAME(S): Prolia, XGEVA What should I tell my health care provider before I take this medicine? They need to know if you have any of these conditions:  dental disease  having surgery or tooth extraction  infection  kidney disease  low levels of calcium or Vitamin D in the blood  malnutrition  on hemodialysis  skin conditions or sensitivity  thyroid or parathyroid disease  an unusual reaction to denosumab, other medicines, foods, dyes, or preservatives  pregnant or trying to get pregnant  breast-feeding How should I use this medicine? This medicine is for injection under the skin. It is given by a health care professional in a hospital or clinic setting. A special MedGuide will be given to you before each treatment. Be sure to read this information carefully each time. For Prolia, talk to your pediatrician regarding the use of this medicine in children. Special care may be needed. For Xgeva, talk to your pediatrician regarding the use of this medicine in children. While this drug may be prescribed for children as young as 13 years for selected conditions, precautions do apply. Overdosage: If you think you have taken too much of this medicine contact a poison control center or emergency room at once. NOTE: This medicine is only for you. Do not share this medicine with others. What if I miss a dose? It is  important not to miss your dose. Call your doctor or health care professional if you are unable to keep an appointment. What may interact with this medicine? Do not take this medicine with any of the following medications:  other medicines containing denosumab This medicine may also interact with the following medications:  medicines that lower your chance of fighting infection  steroid medicines like prednisone or cortisone This list may not describe all possible interactions. Give your health care provider a list of all the medicines, herbs, non-prescription drugs, or dietary supplements you use. Also tell them if you smoke, drink alcohol, or use illegal drugs. Some items may interact with your medicine. What should I watch for while using this medicine? Visit your doctor or health care professional for regular checks on your progress. Your doctor or health care professional may order blood tests and other tests to see how you are doing. Call your doctor or health care professional for advice if you get a fever, chills or sore throat, or other symptoms of a cold or flu. Do not treat yourself. This drug may decrease your body's ability to fight infection. Try to avoid being around people who are sick. You should make sure you get enough calcium and vitamin D while you are taking this medicine, unless your doctor tells you not to. Discuss the foods you eat and the vitamins you take with your health care professional. See your dentist regularly. Brush and floss your teeth as directed. Before you have any dental work done, tell your dentist you are   receiving this medicine. Do not become pregnant while taking this medicine or for 5 months after stopping it. Talk with your doctor or health care professional about your birth control options while taking this medicine. Women should inform their doctor if they wish to become pregnant or think they might be pregnant. There is a potential for serious side  effects to an unborn child. Talk to your health care professional or pharmacist for more information. What side effects may I notice from receiving this medicine? Side effects that you should report to your doctor or health care professional as soon as possible:  allergic reactions like skin rash, itching or hives, swelling of the face, lips, or tongue  bone pain  breathing problems  dizziness  jaw pain, especially after dental work  redness, blistering, peeling of the skin  signs and symptoms of infection like fever or chills; cough; sore throat; pain or trouble passing urine  signs of low calcium like fast heartbeat, muscle cramps or muscle pain; pain, tingling, numbness in the hands or feet; seizures  unusual bleeding or bruising  unusually weak or tired Side effects that usually do not require medical attention (report to your doctor or health care professional if they continue or are bothersome):  constipation  diarrhea  headache  joint pain  loss of appetite  muscle pain  runny nose  tiredness  upset stomach This list may not describe all possible side effects. Call your doctor for medical advice about side effects. You may report side effects to FDA at 1-800-FDA-1088. Where should I keep my medicine? This medicine is only given in a clinic, doctor's office, or other health care setting and will not be stored at home. NOTE: This sheet is a summary. It may not cover all possible information. If you have questions about this medicine, talk to your doctor, pharmacist, or health care provider.  2021 Elsevier/Gold Standard (2018-02-22 16:10:44)

## 2020-12-30 ENCOUNTER — Other Ambulatory Visit: Payer: Self-pay

## 2020-12-30 DIAGNOSIS — C7951 Secondary malignant neoplasm of bone: Secondary | ICD-10-CM

## 2020-12-30 MED ORDER — OXYCODONE HCL 20 MG PO TABS: 1.0000 | ORAL_TABLET | ORAL | 0 refills | Status: DC | PRN

## 2020-12-31 ENCOUNTER — Telehealth: Payer: Self-pay

## 2020-12-31 NOTE — Telephone Encounter (Signed)
I spoke with pt. She was just coming in from work. Pt started the 3rd cycle Afinitor (Afinitor 5mg  po qd) on 12/25/20. She is taking the Afinitor 5mg  daily between 7p-8p with light snack. No missed doses. Denies emesis, fevers, & skin rashes. She does have intermittent nausea, for which she takes Zofran or compazine with relief.

## 2021-01-12 ENCOUNTER — Other Ambulatory Visit: Payer: Self-pay

## 2021-01-12 ENCOUNTER — Telehealth: Payer: Self-pay

## 2021-01-12 DIAGNOSIS — C7951 Secondary malignant neoplasm of bone: Secondary | ICD-10-CM

## 2021-01-12 MED ORDER — FENTANYL 100 MCG/HR TD PT72: 1.0000 | MEDICATED_PATCH | TRANSDERMAL | 0 refills | Status: DC

## 2021-01-12 NOTE — Telephone Encounter (Signed)
[facility] called stating they have not been able to get in touch with patient for her medication. Called patient and notified her. She informs me that the rash on her chest has now spread to her neck area. It is red patchy and patchy.

## 2021-01-17 ENCOUNTER — Telehealth: Payer: Self-pay

## 2021-01-17 NOTE — Progress Notes (Incomplete)
[facility]  [address] [place],  [place]  [address] [phone]  Clinic Day:  01/17/2021  Referring physician: Bonnita Nasuti, MD   This document serves as a record of services personally performed by Hosie Poisson, MD. It was created on their behalf by Curry,Lauren E, a trained medical scribe. The creation of this record is based on the scribe's personal observations and the provider's statements to them.   CHIEF COMPLAINT:  CC: Metastatic breast cancer  Current Treatment:  Will plan for Afinitor 10 mg daily   HISTORY OF PRESENT ILLNESS:  Monica Poole is a 45 y.o. female with a history of stage IIB right breast cancer diagnosed in June 2018.  Estrogen and progesterone receptors were positive at 95% and HER2 negative.  Ki67 was 70%.  She was seen at [facility] in July 2018 for consultation, but did not pursue treatment with [place] at that time.  Testing for hereditary breast and ovarian cancer with the Myriad [facility] Hereditary Cancer panel test in July 2018 did not reveal any clinically significant mutation or variants of uncertain significance.  She was going to go to [facility] for a second opinion, but did not pursue this, and we did not hear back from her regarding her cancer.  Her port was removed in 2018 due to septic infection.  She went to [facility] in [place] and was placed on neoadjuvant chemotherapy for 8 months, but with limited response.  In March 2019, she underwent bilateral mastectomies, but had complications with her reconstruction.  She had expanders placed, but never had the permanent implants inserted.  She received adjuvant radiation.  She was then placed on letrozole, but only completed 6 months.  She started having pain in her right hip in February 2020.  Hip x-rays in April revealed multifocal sclerotic bony metastases.  She then underwent a biopsy of the left anterior iliac bone in May,  which confirmed metastatic carcinoma favoring breast primary.  She was placed on palbociclib/fulvestrant, but only completed 4 months.  These were stopped due to problems with insurance and transportation to [place].    We began seeing her again in November 2020 to manage her widespread bone metastases.  CT chest, abdomen, and pelvis in November revealed innumerable small sclerotic metastases throughout the axial and proximal appendicular skeleton, increased in size, number and degree of sclerosis, favoring progression of osseous metastatic disease.  No lymphadenopathy or other findings of extraosseous metastatic disease in the chest, abdomen, or pelvis.  She also underwent a nuclear medicine whole body bone scan, which revealed osseous metastases at left iliac bone, thoracic spine, and bilateral proximal femora.  We resumed palbociclib/fulvestrant in November.  She received palliative radiation to the bilateral hips completed in December.  She was referred to plastic surgeon regarding her expanders.  She had palmar erythrodysesthesia and was given information on that diagnosis and how to manage it.  Her pain medications were titrated and she did require higher doses of prednisone temporarily.  CT chest, abdomen and pelvis from May 3rd revealed development of a segment 3 hypoattenuating liver lesion, measuring 1.8 cm, concerning for new metastasis.  No other evidence of soft tissue metastasis within the chest, abdomen or pelvis.  Osseous metastasis are primarily similar.  New and progressive areas of sclerosis involving the T6 and S1 vertebral bodies were seen.  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  We have  steadily tapered her prednisone dose down.  Her calcium dose was increased to 600 mg TID.    Left diagnostic mammogram and left breast ultrasound from October 7th revealed an indeterminate 1.2 cm mass involving the lower outer quadrant of the reconstructed left  breast at the inframammary fold.  This may represent focal post surgical dense fibrosis or fat necrosis, though an oil cyst could not be confirmed on the spot tangential mammogram.  Biopsy was recommended and scheduled, but she missed this appointment.  CT chest, abdomen and pelvis from October 7th revealed interval increase in the size of a hypodense metastatic lesion of the left lobe of the liver measuring 3.2 x 2.4 cm, previously 2.1 x 2.0 cm.  Suspect subtle, new hypodense lesions of the superior left lobe of the liver anddjacent liver dome, and interval increase in multiple sclerotic osseous lesions throughout the vertebral bodies, sternum and pelvis.  Overall constellation of findings were consistent with worsened metastatic disease and so her fulvestrant and palbociclib were stopped.  She was scheduled with her dentist on October 28th for a broken tooth, so we held denosumab.  Her white count had decreased from 16.5 to 1.8 with an [place] of 850 from her palbociclib.  She is now on fentanyl at 150 mcg every 3 days.  She was placed on Lasix to use as needed for edema.  When we met at the end of October we discussed the other options of chemotherapy.  She wanted some time to make her decisions.  She started treatment with Aromasin 25 mg daily in December.  INTERVAL HISTORY:  [name] is here for routine follow up prior to her next denosumab, which has had to be rescheduled multiple times.  She started treatment with Aromasin 25 mg daily in December.  She was also started on Afinitor 10 mg daily, but had severe mouth sores, anorexia, and was unable to eat.  It was stopped after 2 weeks and she has been on a break for nearly 1 month, and has finally received the Afinitor 5 mg last weekend.  As her lab work looks good, she can start this now.  She notes a red, pruritic lesion of the central upper chest, which is nonspecific.  She has been using cortisone cream with partial response.  I advised that she try a Nystatin  cream to see if this improves.  She has required Lasix 20 mg daily for the past week, and has had improvement in her lower extremity edema.  She also notes a runny nose.  She continues to have right hip pain which she rates as a 5/10 today.  She continues to report insomnia for which she uses Ambien 15 mg at bedtime.  Blood counts are unremarkable.  Her  appetite is fair, and she has lost 2 pounds since her last visit.  She denies fever, chills or other signs of infection.  She denies nausea, vomiting, bowel issues, or abdominal pain.  She denies sore throat, cough, dyspnea, or chest pain.   Journei is here for follow up prior to her next Xgeva.   Her  appetite is good, and she has gained/lost _ pounds since her last visit.  She denies fever, chills or other signs of infection.  She denies nausea, vomiting, bowel issues, or abdominal pain.  She denies sore throat, cough, dyspnea, or chest pain.  REVIEW OF SYSTEMS:  Review of Systems  Constitutional: Positive for appetite change (improved). Negative for chills, fatigue, fever and unexpected weight change.  HENT:  Negative.   Eyes: Negative.   Respiratory: Negative.  Negative for chest tightness, cough, hemoptysis, shortness of breath and wheezing.   Cardiovascular: Negative.  Negative for chest pain, leg swelling and palpitations.  Gastrointestinal: Negative for abdominal distention, abdominal pain, blood in stool, constipation, diarrhea, nausea and vomiting.  Endocrine: Negative.   Genitourinary: Negative.  Negative for difficulty urinating, dysuria, frequency and hematuria.   Musculoskeletal: Positive for arthralgias and myalgias. Negative for gait problem.       Right hip pain  Skin:       Red, pruritic lesion of the left upper chest, which is nonspecific.  Neurological: Negative.  Negative for dizziness, extremity weakness, gait problem, headaches, light-headedness, numbness, seizures and speech difficulty.  Hematological: Negative.    Psychiatric/Behavioral: Positive for sleep disturbance (insomnia). Negative for depression. The patient is not nervous/anxious.      VITALS:  There were no vitals taken for this visit.  Wt Readings from Last 3 Encounters:  12/24/20 140 lb 4 oz (63.6 kg)  12/20/20 143 lb 14.4 oz (65.3 kg)  11/01/20 143 lb 12 oz (65.2 kg)    There is no height or weight on file to calculate BMI.  Performance status (ECOG): 1 - Symptomatic but completely ambulatory  PHYSICAL EXAM:  Physical Exam.    LABS:   CBC Latest Ref Rng & Units 12/20/2020 11/01/2020 09/30/2020  WBC - 4.9 3.9 4.9  Hemoglobin 12.0 - 16.0 14.5 14.1 13.4  Hematocrit 36 - 46 43 42 39  Platelets 150 - 399 183 160 239   CMP Latest Ref Rng & Units 12/20/2020 11/01/2020 09/30/2020  Glucose 70 - 99 mg/dL - - -  BUN 4 - _0 Creatinine 0.5 - 1.1 0.8 0.7 0.7  Sodium 137 - 147 136(A) 139 137  Potassium 3.4 - 5.3 4.6 4.0 3.8  Chloride 99 - 108 102 105 105  CO2 13 - 22 29(A) 28(A) 27(A)  Calcium 8.7 - 10.7 9.5 9.0 9.6  Total Protein 6.5 - 8.1 g/dL - - -  Total Bilirubin 0.3 - 1.2 mg/dL - - -  Alkaline Phos 25 - 125 79 102 67  AST 13 - 35 55(A) 90(A) 49(A)  ALT 7 - 35 34 67(A) 36(A)     STUDIES:  No results found.   Allergies:  Allergies  Allergen Reactions  . Bee Venom Anaphylaxis  . Dicyclomine Other (See Comments)    CARDIAC ARREST  Other reaction(s): Other (See Comments), Other (See Comments) CARDIAC ARREST CARDIAC ARREST Other reaction(s): cardiac arrest per Pt  . Latex Rash and Other (See Comments)    BLISTERS  Other reaction(s): Other (See Comments) BLISTERS  . Wasp Venom Protein Anaphylaxis  . Bentyl [Dicyclomine Hcl] Other (See Comments)    CARDIAC ARREST  . Other Other (See Comments) and Rash    Blisters, also- ONLY PAPER TAPE!! Other reaction(s): Unknown  . Tape Rash and Other (See Comments)    Blisters, also- ONLY PAPER TAPE!!    Current Medications: Current Outpatient Medications  Medication  Sig Dispense Refill  . AFINITOR 10 MG tablet Take 10 mg by mouth daily.    Marland Kitchen albuterol (ACCUNEB) 1.25 MG/3ML nebulizer solution Take 1 ampule by nebulization every 4 (four) hours as needed for wheezing.     Marland Kitchen albuterol (VENTOLIN HFA) 108 (90 Base) MCG/ACT inhaler Can inhale two puffs every four to six hours as needed for cough or wheeze. 8 g 1  . ALPRAZolam (XANAX) 0.5 MG tablet  Take 1 tablet (0.5 mg total) by mouth 4 (four) times daily. 120 tablet 1  . budesonide (PULMICORT) 0.5 MG/2ML nebulizer solution Take 0.5 mg by nebulization 2 (two) times daily as needed (wheezing).    . calcium carbonate (OS-CAL) 600 MG tablet Take 1 tablet (600 mg total) by mouth 3 (three) times daily. 30 tablet 0  . cetirizine (ZYRTEC) 10 MG tablet 1 tablet    . clobetasol ointment (TEMOVATE) 0.93 % 1 application to affected area    . denosumab (XGEVA) 120 MG/1.7ML SOLN injection Inject 120 mg into the skin once.    Marland Kitchen dexamethasone (DECADRON) 0.5 MG/5ML solution Take 1 mg by mouth 4 (four) times daily.    Marland Kitchen everolimus (AFINITOR) 5 MG tablet Take 1 tablet (5 mg total) by mouth daily. 30 tablet 2  . exemestane (AROMASIN) 25 MG tablet Take 1 tablet (25 mg total) by mouth daily after breakfast. 30 tablet 5  . fentaNYL (DURAGESIC) 100 MCG/HR Place 1 patch onto the skin every 3 (three) days. 10 patch 0  . fluocinonide ointment (LIDEX) 0.05 % See admin instructions.    . Fluticasone-Umeclidin-Vilant (TRELEGY ELLIPTA) 200-62.5-25 MCG/INH AEPB Inhale 1 Dose into the lungs daily. Rinse, gargle, and spit after use. (Patient taking differently: Inhale 1 Dose into the lungs daily as needed. Rinse, gargle, and spit after use.) 60 each 5  . furosemide (LASIX) 20 MG tablet Take 1 tablet (20 mg total) by mouth daily. 30 tablet 5  . ipratropium (ATROVENT) 0.06 % nasal spray Can use one to two sprays in each nostril every six hours as needed to dry up nose. 15 mL 5  . ipratropium-albuterol (DUONEB) 0.5-2.5 (3) MG/3ML SOLN Take 3 mLs by  nebulization every 4 (four) hours as needed. 360 mL 1  . magic mouthwash w/lidocaine SOLN Take 5 mLs by mouth 4 (four) times daily as needed for mouth pain. 320 mL 3  . mometasone-formoterol (DULERA) 200-5 MCG/ACT AERO Inhale two puffs twice daily to prevent cough or wheeze.  Rinse, gargle, and spit after use. (Patient taking differently: Inhale 2 puffs into the lungs 2 (two) times daily as needed (for "flares" and rinse mouth afterwards). ) 1 Inhaler 5  . nitrofurantoin, macrocrystal-monohydrate, (MACROBID) 100 MG capsule Take 1 capsule (100 mg total) by mouth 2 (two) times daily. 14 capsule 1  . ondansetron (ZOFRAN) 4 MG tablet Take 1 tablet (4 mg total) by mouth every 8 (eight) hours as needed for nausea or vomiting. 20 tablet 0  . Oxycodone HCl 20 MG TABS Take 1 tablet (20 mg total) by mouth every 4 (four) hours as needed. Refilled on 09/30/20 180 tablet 0  . pantoprazole (PROTONIX) 40 MG tablet Take 1 tablet (40 mg total) by mouth 2 (two) times daily. 60 tablet 5  . phenazopyridine (PYRIDIUM) 100 MG tablet Take 1 tablet (100 mg total) by mouth 3 (three) times daily as needed for pain. 10 tablet 0  . Potassium Chloride ER 20 MEQ TBCR Take 1 tablet by mouth 2 (two) times daily.    . predniSONE (DELTASONE) 5 MG tablet Take by mouth 3 (three) times daily.    . prochlorperazine (COMPAZINE) 10 MG tablet Take 1 tablet (10 mg total) by mouth every 8 (eight) hours as needed for nausea or vomiting. 30 tablet 1  . triamcinolone cream (KENALOG) 0.1 % Apply 1 application topically as needed (to affected, irritated sites). Prn     . venlafaxine XR (EFFEXOR-XR) 150 MG 24 hr capsule Take 1 capsule (150  mg total) by mouth daily. 90 capsule 1  . venlafaxine XR (EFFEXOR-XR) 75 MG 24 hr capsule TAKE 1 CAPSULE BY MOUTH DAILY ALONG WITH 150 MG 90 capsule 3  . zolpidem (AMBIEN) 10 MG tablet Take 1.5 tablets (15 mg total) by mouth at bedtime. 45 tablet 1   No current facility-administered medications for this visit.      ASSESSMENT & PLAN:   Assessment:   1. Stage IIB right breast cancer diagnosed in June 2018.  This was treated at the [facility] in [place], [place] with neoadjuvant chemotherapy, and adjuvant radiation.  She underwent bilateral mastectomies in March 2019.   2. History of cervical cancer at a young age, treated with surgery and radiation.  3. 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  She was placed on palbociclib/fulvestrant, but only completed 4 months of therapy, then discontinued that on her own.  We assumed follow-up and restarted palbociclib/fulvestrant in November 2020, and started her on monthly denosumab, but she has missed some doses.  She received palliative radiation to the bilateral hips completed in December 2020.  Palbociclib was resumed in May 2021, and then placed on hold this summer due to poor healing of her reconstruction.  We resumed this at the end of June and now have stopped it as of the end of October due to progression of disease.  We have started Exemestane and Evorilimus but she has had some interruptions due to toxicities.    4. Depression, now on Effexor dose at 225 mg daily with fairly good control of her symptoms.  She still lacks motivation at times.  5. Bilateral breast reconstruction, with implants placed on February 17th.  This was healing well, but the right side had to be removed due to the skin splitting open.  I do feel a nodule in the lateral left reconstruction and we will monitor this area.  6. Diagnosis of breast cancer at a very young age.  She did not have clinically significant mutation or variants of uncertain significance on Myriad myRisk done in July 2018.  Testing for PIK3CA was negative.  7. Lymphedema of the right upper extremity.    8. 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   9. Hypodense metastatic liver lesion of the left lobe,  and increased, now measuring 3.2 x 2.4 cm.  Suspect subtle, new hypodense lesions were also observed on most recent CT imaging in October.  10. Mild hypocalcemia, resolved.  We will have her continue oral calcium 600 mg three times daily.  11.  Pedal edema, which has resumed again, and so she has been using Lasix 20 mg daily.  Since it is mild today, I suggested that she go to every other day.  12.  Small scaly lesion of the central chest, which may be a keratosis or fungal infection.  We will see if it responds to Nystatin cream.  Plan: She continues Aromasin 25 mg and Afinitor 5 mg daily.  She will proceed with Delton See on March 25th.   Her pain is well controlled on her current regimen, which she will continue.  She has been having worsening insomnia and using 1 and 1/2 of the 10 mg Ambien tablets at bedtime.  I will send in a refill for Ambien 10 mg, to take 1 and 1/2 tablets today.  We will see her back in 4 weeks with CBC and CMP prior to her next Xgeva.  Likely we  will repeat CT imaging in June since there were several interruptions of her therapy.  The patient verbalizes understanding of an agreement to the plan today.  She knows to call the office should any new questions or concerns arise.   I provided 25 minutes of face-to-face time during this this encounter and > 50% was spent counseling as documented under my assessment and plan.    Derwood Kaplan, MD [facility] [address] [place] [address] Dept: [phone] Dept Fax: [phone]   I, Rita Ohara, am acting as scribe for Derwood Kaplan, MD  I have reviewed this report as typed by the medical scribe, and it is complete and accurate.

## 2021-01-17 NOTE — Telephone Encounter (Signed)
Attempted to contact patient. No answer. 

## 2021-01-17 NOTE — Telephone Encounter (Signed)
-----   Message from Derwood Kaplan, MD sent at 01/13/2021  3:22 PM EDT ----- Regarding: call pt I think you already informed her that [facility] trying to reach her.  As far as the rash/skin problem, we can see her if she can come in, or just keep appt next week if not too severe

## 2021-01-18 ENCOUNTER — Other Ambulatory Visit: Payer: Self-pay | Admitting: Oncology

## 2021-01-18 ENCOUNTER — Telehealth: Payer: Self-pay | Admitting: Oncology

## 2021-01-20 ENCOUNTER — Other Ambulatory Visit: Payer: Self-pay

## 2021-01-20 ENCOUNTER — Inpatient Hospital Stay: Payer: Medicare Other | Attending: Oncology

## 2021-01-20 ENCOUNTER — Inpatient Hospital Stay: Payer: Medicare Other | Admitting: Oncology

## 2021-01-20 ENCOUNTER — Encounter: Payer: Self-pay | Admitting: Hematology and Oncology

## 2021-01-20 ENCOUNTER — Inpatient Hospital Stay (INDEPENDENT_AMBULATORY_CARE_PROVIDER_SITE_OTHER): Payer: Medicare Other | Admitting: Hematology and Oncology

## 2021-01-20 ENCOUNTER — Inpatient Hospital Stay: Payer: Medicare Other

## 2021-01-20 VITALS — BP 145/87 | HR 91 | Temp 98.2°F | Resp 18 | Ht 62.0 in | Wt 136.2 lb

## 2021-01-20 DIAGNOSIS — C50411 Malignant neoplasm of upper-outer quadrant of right female breast: Secondary | ICD-10-CM

## 2021-01-20 DIAGNOSIS — Z7951 Long term (current) use of inhaled steroids: Secondary | ICD-10-CM | POA: Insufficient documentation

## 2021-01-20 DIAGNOSIS — Z8541 Personal history of malignant neoplasm of cervix uteri: Secondary | ICD-10-CM | POA: Insufficient documentation

## 2021-01-20 DIAGNOSIS — I890 Lymphedema, not elsewhere classified: Secondary | ICD-10-CM | POA: Insufficient documentation

## 2021-01-20 DIAGNOSIS — Z9013 Acquired absence of bilateral breasts and nipples: Secondary | ICD-10-CM | POA: Insufficient documentation

## 2021-01-20 DIAGNOSIS — G629 Polyneuropathy, unspecified: Secondary | ICD-10-CM | POA: Insufficient documentation

## 2021-01-20 DIAGNOSIS — C787 Secondary malignant neoplasm of liver and intrahepatic bile duct: Secondary | ICD-10-CM

## 2021-01-20 DIAGNOSIS — C7951 Secondary malignant neoplasm of bone: Secondary | ICD-10-CM | POA: Insufficient documentation

## 2021-01-20 DIAGNOSIS — F411 Generalized anxiety disorder: Secondary | ICD-10-CM | POA: Diagnosis not present

## 2021-01-20 DIAGNOSIS — F32A Depression, unspecified: Secondary | ICD-10-CM | POA: Diagnosis not present

## 2021-01-20 DIAGNOSIS — Z170 Estrogen receptor positive status [ER+]: Secondary | ICD-10-CM | POA: Insufficient documentation

## 2021-01-20 DIAGNOSIS — Z7952 Long term (current) use of systemic steroids: Secondary | ICD-10-CM | POA: Insufficient documentation

## 2021-01-20 DIAGNOSIS — Z79899 Other long term (current) drug therapy: Secondary | ICD-10-CM | POA: Insufficient documentation

## 2021-01-20 DIAGNOSIS — Z79811 Long term (current) use of aromatase inhibitors: Secondary | ICD-10-CM | POA: Diagnosis not present

## 2021-01-20 DIAGNOSIS — M79671 Pain in right foot: Secondary | ICD-10-CM

## 2021-01-20 DIAGNOSIS — C50911 Malignant neoplasm of unspecified site of right female breast: Secondary | ICD-10-CM | POA: Insufficient documentation

## 2021-01-20 DIAGNOSIS — C7952 Secondary malignant neoplasm of bone marrow: Secondary | ICD-10-CM

## 2021-01-20 LAB — BASIC METABOLIC PANEL
BUN: 10 (ref 4–21)
CO2: 32 — AB (ref 13–22)
Chloride: 101 (ref 99–108)
Creatinine: 0.8 (ref 0.5–1.1)
Glucose: 95
Potassium: 3.8 (ref 3.4–5.3)
Sodium: 138 (ref 137–147)

## 2021-01-20 LAB — HEPATIC FUNCTION PANEL
ALT: 44 — AB (ref 7–35)
AST: 64 — AB (ref 13–35)
Alkaline Phosphatase: 89 (ref 25–125)
Bilirubin, Total: 0.5

## 2021-01-20 LAB — COMPREHENSIVE METABOLIC PANEL
Albumin: 4.1 (ref 3.5–5.0)
Calcium: 8.9 (ref 8.7–10.7)

## 2021-01-20 MED ORDER — ALPRAZOLAM 0.5 MG PO TABS: 0.5000 mg | ORAL_TABLET | Freq: Four times a day (QID) | ORAL | 1 refills | Status: DC

## 2021-01-20 MED ORDER — ONDANSETRON HCL 8 MG PO TABS: 8.0000 mg | ORAL_TABLET | Freq: Three times a day (TID) | ORAL | 3 refills | Status: DC | PRN

## 2021-01-20 MED ORDER — TRIAMCINOLONE ACETONIDE 0.1 % EX CREA: 1.0000 "application " | TOPICAL_CREAM | Freq: Two times a day (BID) | CUTANEOUS | 0 refills | Status: AC

## 2021-01-20 MED ORDER — AMOXICILLIN-POT CLAVULANATE 875-125 MG PO TABS: 1.0000 | ORAL_TABLET | Freq: Two times a day (BID) | ORAL | 0 refills | Status: DC
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
## 2021-01-21 ENCOUNTER — Encounter: Payer: Self-pay | Admitting: Hematology and Oncology

## 2021-01-21 ENCOUNTER — Inpatient Hospital Stay: Payer: Medicare Other

## 2021-01-21 ENCOUNTER — Telehealth: Payer: Self-pay | Admitting: Hematology and Oncology

## 2021-01-21 ENCOUNTER — Other Ambulatory Visit: Payer: Self-pay

## 2021-01-21 LAB — CANCER ANTIGEN 27.29: CA 27.29: 1466.9 U/mL — ABNORMAL HIGH (ref 0.0–38.6)

## 2021-01-21 NOTE — Progress Notes (Unsigned)
PT STABLE AT TIME OF DISCHARGE 

## 2021-01-21 NOTE — Telephone Encounter (Signed)
01/21/21 Lft vm -injection appt today and future appts

## 2021-01-24 ENCOUNTER — Inpatient Hospital Stay: Payer: Medicare Other

## 2021-01-24 ENCOUNTER — Other Ambulatory Visit: Payer: Self-pay

## 2021-01-24 ENCOUNTER — Other Ambulatory Visit: Payer: Self-pay | Admitting: Pharmacist

## 2021-01-24 ENCOUNTER — Other Ambulatory Visit: Payer: Self-pay | Admitting: Hematology and Oncology

## 2021-01-24 VITALS — BP 148/86 | HR 86 | Temp 98.4°F | Resp 18 | Ht 62.0 in | Wt 142.0 lb

## 2021-01-24 DIAGNOSIS — C7951 Secondary malignant neoplasm of bone: Secondary | ICD-10-CM | POA: Diagnosis not present

## 2021-01-24 DIAGNOSIS — C7952 Secondary malignant neoplasm of bone marrow: Secondary | ICD-10-CM

## 2021-01-24 MED ORDER — DENOSUMAB 120 MG/1.7ML ~~LOC~~ SOLN
SUBCUTANEOUS | Status: AC
  Filled 2021-01-24: qty 1.7

## 2021-01-24 MED ORDER — DENOSUMAB 120 MG/1.7ML ~~LOC~~ SOLN
120.0000 mg | Freq: Once | SUBCUTANEOUS | Status: AC
Start: 2021-01-24 — End: 2021-01-24
  Administered 2021-01-24: 120 mg via SUBCUTANEOUS

## 2021-01-24 NOTE — Patient Instructions (Signed)
Denosumab injection What is this medicine? DENOSUMAB (den oh sue mab) slows bone breakdown. Prolia is used to treat osteoporosis in women after menopause and in men, and in people who are taking corticosteroids for 6 months or more. Xgeva is used to treat a high calcium level due to cancer and to prevent bone fractures and other bone problems caused by multiple myeloma or cancer bone metastases. Xgeva is also used to treat giant cell tumor of the bone. This medicine may be used for other purposes; ask your health care provider or pharmacist if you have questions. COMMON BRAND NAME(S): Prolia, XGEVA What should I tell my health care provider before I take this medicine? They need to know if you have any of these conditions:  dental disease  having surgery or tooth extraction  infection  kidney disease  low levels of calcium or Vitamin D in the blood  malnutrition  on hemodialysis  skin conditions or sensitivity  thyroid or parathyroid disease  an unusual reaction to denosumab, other medicines, foods, dyes, or preservatives  pregnant or trying to get pregnant  breast-feeding How should I use this medicine? This medicine is for injection under the skin. It is given by a health care professional in a hospital or clinic setting. A special MedGuide will be given to you before each treatment. Be sure to read this information carefully each time. For Prolia, talk to your pediatrician regarding the use of this medicine in children. Special care may be needed. For Xgeva, talk to your pediatrician regarding the use of this medicine in children. While this drug may be prescribed for children as young as 13 years for selected conditions, precautions do apply. Overdosage: If you think you have taken too much of this medicine contact a poison control center or emergency room at once. NOTE: This medicine is only for you. Do not share this medicine with others. What if I miss a dose? It is  important not to miss your dose. Call your doctor or health care professional if you are unable to keep an appointment. What may interact with this medicine? Do not take this medicine with any of the following medications:  other medicines containing denosumab This medicine may also interact with the following medications:  medicines that lower your chance of fighting infection  steroid medicines like prednisone or cortisone This list may not describe all possible interactions. Give your health care provider a list of all the medicines, herbs, non-prescription drugs, or dietary supplements you use. Also tell them if you smoke, drink alcohol, or use illegal drugs. Some items may interact with your medicine. What should I watch for while using this medicine? Visit your doctor or health care professional for regular checks on your progress. Your doctor or health care professional may order blood tests and other tests to see how you are doing. Call your doctor or health care professional for advice if you get a fever, chills or sore throat, or other symptoms of a cold or flu. Do not treat yourself. This drug may decrease your body's ability to fight infection. Try to avoid being around people who are sick. You should make sure you get enough calcium and vitamin D while you are taking this medicine, unless your doctor tells you not to. Discuss the foods you eat and the vitamins you take with your health care professional. See your dentist regularly. Brush and floss your teeth as directed. Before you have any dental work done, tell your dentist you are   receiving this medicine. Do not become pregnant while taking this medicine or for 5 months after stopping it. Talk with your doctor or health care professional about your birth control options while taking this medicine. Women should inform their doctor if they wish to become pregnant or think they might be pregnant. There is a potential for serious side  effects to an unborn child. Talk to your health care professional or pharmacist for more information. What side effects may I notice from receiving this medicine? Side effects that you should report to your doctor or health care professional as soon as possible:  allergic reactions like skin rash, itching or hives, swelling of the face, lips, or tongue  bone pain  breathing problems  dizziness  jaw pain, especially after dental work  redness, blistering, peeling of the skin  signs and symptoms of infection like fever or chills; cough; sore throat; pain or trouble passing urine  signs of low calcium like fast heartbeat, muscle cramps or muscle pain; pain, tingling, numbness in the hands or feet; seizures  unusual bleeding or bruising  unusually weak or tired Side effects that usually do not require medical attention (report to your doctor or health care professional if they continue or are bothersome):  constipation  diarrhea  headache  joint pain  loss of appetite  muscle pain  runny nose  tiredness  upset stomach This list may not describe all possible side effects. Call your doctor for medical advice about side effects. You may report side effects to FDA at 1-800-FDA-1088. Where should I keep my medicine? This medicine is only given in a clinic, doctor's office, or other health care setting and will not be stored at home. NOTE: This sheet is a summary. It may not cover all possible information. If you have questions about this medicine, talk to your doctor, pharmacist, or health care provider.  2021 Elsevier/Gold Standard (2018-02-22 16:10:44)

## 2021-01-27 ENCOUNTER — Other Ambulatory Visit: Payer: Self-pay

## 2021-01-27 ENCOUNTER — Other Ambulatory Visit: Payer: Self-pay | Admitting: Hematology and Oncology

## 2021-01-27 DIAGNOSIS — C7951 Secondary malignant neoplasm of bone: Secondary | ICD-10-CM

## 2021-01-27 DIAGNOSIS — J019 Acute sinusitis, unspecified: Secondary | ICD-10-CM

## 2021-01-27 MED ORDER — AMOXICILLIN-POT CLAVULANATE 875-125 MG PO TABS: 1.0000 | ORAL_TABLET | Freq: Two times a day (BID) | ORAL | 0 refills | Status: DC

## 2021-01-27 MED ORDER — OXYCODONE HCL 20 MG PO TABS: 1.0000 | ORAL_TABLET | ORAL | 0 refills | Status: DC | PRN

## 2021-01-28 ENCOUNTER — Other Ambulatory Visit: Payer: Self-pay | Admitting: Hematology and Oncology

## 2021-01-28 ENCOUNTER — Telehealth: Payer: Self-pay

## 2021-01-28 MED ORDER — PREDNISONE 20 MG PO TABS: 20.0000 mg | ORAL_TABLET | Freq: Every day | ORAL | 0 refills | Status: DC

## 2021-01-28 NOTE — Progress Notes (Signed)
pred 

## 2021-01-28 NOTE — Telephone Encounter (Deleted)
I notified that you sent in the antibiotic & pain med. She stated, "I really need that steroid prescription. I know when I need it. I have asthma and everything is yellow outside. I had to give myself neb treatments every 2 hours last night because Im wheezing so bad. I couldn't sleep. Ask if they will reconsider"?   ----- Message from Marvia Pickles, PA-C sent at 01/27/2021  3:01 PM EDT ----- Regarding: RE: Augmentin missing, prednisone script request Contact: [phone] I sent in another prescription for Augmentin and refilled her oxycodone 20mg . ----- Message ----- From: Dairl Ponder, RN Sent: 01/27/2021   2:23 PM EDT To: Marvia Pickles, PA-C Subject: Augmentin missing, prednisone script request   Pt has lost the Augmentin prescription. The pharmacy verified that she picked it up on the 28th, along with other prescriptions. Pt states, "there were 4 prescriptions that were stapled together, but I cant find the Augmentin. I've searched the car and house hi and low. I haven't taken any antibiotic. I still have the thick yellow nasal congestion and I'm wheezing like crazy today. Do you think I can get her to send me ion a prednisone 20mg  taper? I know how to taper it. We are trying to keep me from getting pneumonia". Pt declines fevers & chills. She has taken the nebulizer treatments today.

## 2021-01-28 NOTE — Telephone Encounter (Addendum)
I spoke with pt, & notified her of below. She isn't taking prednisone 5mg  po TID.  ----- Message from Marvia Pickles, PA-C sent at 01/28/2021  4:53 PM EDT ----- Regarding: RE: Augmentin missing, prednisone 20mg  script request Contact: [phone] I will send in a short course. According to her med list, she is on prednisone 5mg  tid. ----- Message ----- From: Dairl Ponder, RN Sent: 01/28/2021  11:29 AM EDT To: Thalia Bloodgood Mosher, PA-C Subject: RE: Augmentin missing, prednisone 20mg  scrip#  I notified that you sent in the antibiotic & pain med. She stated, "I really need that prednisone 20mg  prescription. I know when I need it. I have asthma and everything is yellow outside. I had to give myself neb treatments every 2 hours last night because Im wheezing so bad. I couldn't sleep. Ask if they will reconsider, at least to get me through the weekend"?  ----- Message ----- From: Marvia Pickles, PA-C Sent: 01/27/2021   3:01 PM EDT To: Dairl Ponder, RN Subject: RE: Augmentin missing, prednisone script req#  I sent in another prescription for Augmentin and refilled her oxycodone 20mg . ----- Message ----- From: Dairl Ponder, RN Sent: 01/27/2021   2:23 PM EDT To: Marvia Pickles, PA-C Subject: Augmentin missing, prednisone script request   Pt has lost the Augmentin prescription. The pharmacy verified that she picked it up on the 28th, along with other prescriptions. Pt states, "there were 4 prescriptions that were stapled together, but I cant find the Augmentin. I've searched the car and house hi and low. I haven't taken any antibiotic. I still have the thick yellow nasal congestion and I'm wheezing like crazy today. Do you think I can get her to send me ion a prednisone 20mg  taper? I know how to taper it. We are trying to keep me from getting pneumonia". Pt declines fevers & chills. She has taken the nebulizer treatments today.

## 2021-02-02 ENCOUNTER — Other Ambulatory Visit: Payer: Self-pay | Admitting: Hematology and Oncology

## 2021-02-03 ENCOUNTER — Other Ambulatory Visit: Payer: Self-pay | Admitting: Oncology

## 2021-02-03 ENCOUNTER — Other Ambulatory Visit: Payer: Self-pay

## 2021-02-03 DIAGNOSIS — C7951 Secondary malignant neoplasm of bone: Secondary | ICD-10-CM

## 2021-02-03 DIAGNOSIS — C7952 Secondary malignant neoplasm of bone marrow: Secondary | ICD-10-CM

## 2021-02-03 DIAGNOSIS — C787 Secondary malignant neoplasm of liver and intrahepatic bile duct: Secondary | ICD-10-CM

## 2021-02-03 MED ORDER — FENTANYL 50 MCG/HR TD PT72: 1.0000 | MEDICATED_PATCH | TRANSDERMAL | 0 refills | Status: DC

## 2021-02-07 ENCOUNTER — Telehealth: Payer: Self-pay

## 2021-02-07 NOTE — Telephone Encounter (Addendum)
If pt calls, I am working with insurance company regarding the denial of her Ambien 10mg  1.5 tab po Q HS.  I spoke with pharmacy technician, who states that insurance is flagging the prescription, as it is written for 45 tabs, and the maximum daily dose is 10 mg.   I then asked her if they filled the last Ambien 10mg  1.5 tabs po q HS qty #45. She replied, "Yes and they covered it with $) copay).   @ 245pm, I notified Monica Poole that her Ambien script is ready @ pharmacy.

## 2021-02-08 ENCOUNTER — Other Ambulatory Visit: Payer: Self-pay

## 2021-02-08 ENCOUNTER — Other Ambulatory Visit: Payer: Self-pay | Admitting: Hematology and Oncology

## 2021-02-08 MED ORDER — ZOLPIDEM TARTRATE ER 12.5 MG PO TBCR: 12.5000 mg | EXTENDED_RELEASE_TABLET | Freq: Every evening | ORAL | 0 refills | Status: DC | PRN

## 2021-02-17 ENCOUNTER — Encounter: Payer: Self-pay | Admitting: Hematology and Oncology

## 2021-02-17 ENCOUNTER — Other Ambulatory Visit: Payer: Self-pay | Admitting: Hematology and Oncology

## 2021-02-17 ENCOUNTER — Inpatient Hospital Stay (INDEPENDENT_AMBULATORY_CARE_PROVIDER_SITE_OTHER): Payer: Medicare Other | Admitting: Hematology and Oncology

## 2021-02-17 ENCOUNTER — Telehealth: Payer: Self-pay | Admitting: Hematology and Oncology

## 2021-02-17 ENCOUNTER — Inpatient Hospital Stay: Payer: Medicare Other | Admitting: Hematology and Oncology

## 2021-02-17 ENCOUNTER — Other Ambulatory Visit: Payer: Self-pay

## 2021-02-17 ENCOUNTER — Inpatient Hospital Stay: Payer: Medicare Other | Attending: Oncology

## 2021-02-17 VITALS — BP 127/79 | HR 77 | Temp 97.8°F | Resp 18 | Ht 62.0 in | Wt 134.5 lb

## 2021-02-17 DIAGNOSIS — C50411 Malignant neoplasm of upper-outer quadrant of right female breast: Secondary | ICD-10-CM

## 2021-02-17 DIAGNOSIS — R600 Localized edema: Secondary | ICD-10-CM | POA: Insufficient documentation

## 2021-02-17 DIAGNOSIS — C7951 Secondary malignant neoplasm of bone: Secondary | ICD-10-CM

## 2021-02-17 DIAGNOSIS — Z79899 Other long term (current) drug therapy: Secondary | ICD-10-CM | POA: Insufficient documentation

## 2021-02-17 DIAGNOSIS — Z170 Estrogen receptor positive status [ER+]: Secondary | ICD-10-CM

## 2021-02-17 DIAGNOSIS — C7952 Secondary malignant neoplasm of bone marrow: Secondary | ICD-10-CM | POA: Diagnosis not present

## 2021-02-17 DIAGNOSIS — C50512 Malignant neoplasm of lower-outer quadrant of left female breast: Secondary | ICD-10-CM | POA: Insufficient documentation

## 2021-02-17 DIAGNOSIS — R112 Nausea with vomiting, unspecified: Secondary | ICD-10-CM | POA: Insufficient documentation

## 2021-02-17 DIAGNOSIS — M549 Dorsalgia, unspecified: Secondary | ICD-10-CM | POA: Insufficient documentation

## 2021-02-17 DIAGNOSIS — C787 Secondary malignant neoplasm of liver and intrahepatic bile duct: Secondary | ICD-10-CM | POA: Insufficient documentation

## 2021-02-17 LAB — CBC AND DIFFERENTIAL
HCT: 43 (ref 36–46)
Hemoglobin: 14 (ref 12.0–16.0)
Neutrophils Absolute: 2.38
Platelets: 174 (ref 150–399)
WBC: 3.6

## 2021-02-17 LAB — BASIC METABOLIC PANEL
BUN: 15 (ref 4–21)
CO2: 29 — AB (ref 13–22)
Chloride: 105 (ref 99–108)
Creatinine: 0.7 (ref 0.5–1.1)
Glucose: 103
Potassium: 4.5 (ref 3.4–5.3)
Sodium: 137 (ref 137–147)

## 2021-02-17 LAB — COMPREHENSIVE METABOLIC PANEL
Albumin: 4.1 (ref 3.5–5.0)
Calcium: 9 (ref 8.7–10.7)

## 2021-02-17 LAB — HEPATIC FUNCTION PANEL
ALT: 44 — AB (ref 7–35)
AST: 70 — AB (ref 13–35)
Alkaline Phosphatase: 77 (ref 25–125)
Bilirubin, Total: 0.4

## 2021-02-17 LAB — CBC: RBC: 5.12 — AB (ref 3.87–5.11)

## 2021-02-17 MED ORDER — OXYCODONE HCL 20 MG PO TABS: 1.0000 | ORAL_TABLET | ORAL | 0 refills | Status: DC | PRN

## 2021-02-17 MED ORDER — NYSTATIN-TRIAMCINOLONE 100000-0.1 UNIT/GM-% EX OINT: 1.0000 "application " | TOPICAL_OINTMENT | Freq: Two times a day (BID) | CUTANEOUS | 2 refills | Status: AC
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
## 2021-02-17 NOTE — Telephone Encounter (Signed)
Per 4/21 LOS, patient scheduled for May Appt's.  Gave patient Appt Summary

## 2021-02-18 ENCOUNTER — Inpatient Hospital Stay: Payer: Medicare Other

## 2021-02-21 ENCOUNTER — Other Ambulatory Visit: Payer: Self-pay

## 2021-02-21 ENCOUNTER — Inpatient Hospital Stay: Payer: Medicare Other

## 2021-02-21 VITALS — BP 122/82 | HR 78 | Temp 98.0°F | Resp 18 | Ht 62.0 in | Wt 130.2 lb

## 2021-02-21 DIAGNOSIS — M549 Dorsalgia, unspecified: Secondary | ICD-10-CM | POA: Diagnosis not present

## 2021-02-21 DIAGNOSIS — C7951 Secondary malignant neoplasm of bone: Secondary | ICD-10-CM | POA: Diagnosis not present

## 2021-02-21 DIAGNOSIS — R600 Localized edema: Secondary | ICD-10-CM | POA: Diagnosis not present

## 2021-02-21 DIAGNOSIS — Z170 Estrogen receptor positive status [ER+]: Secondary | ICD-10-CM | POA: Diagnosis not present

## 2021-02-21 DIAGNOSIS — C50512 Malignant neoplasm of lower-outer quadrant of left female breast: Secondary | ICD-10-CM | POA: Diagnosis not present

## 2021-02-21 DIAGNOSIS — Z79899 Other long term (current) drug therapy: Secondary | ICD-10-CM | POA: Diagnosis not present

## 2021-02-21 DIAGNOSIS — R112 Nausea with vomiting, unspecified: Secondary | ICD-10-CM | POA: Diagnosis not present

## 2021-02-21 DIAGNOSIS — C787 Secondary malignant neoplasm of liver and intrahepatic bile duct: Secondary | ICD-10-CM | POA: Diagnosis not present

## 2021-02-21 MED ORDER — DENOSUMAB 120 MG/1.7ML ~~LOC~~ SOLN
SUBCUTANEOUS | Status: AC
  Filled 2021-02-21: qty 1.7

## 2021-02-21 MED ORDER — DENOSUMAB 120 MG/1.7ML ~~LOC~~ SOLN
120.0000 mg | Freq: Once | SUBCUTANEOUS | Status: AC
  Administered 2021-02-21: 120 mg via SUBCUTANEOUS

## 2021-02-21 NOTE — Patient Instructions (Signed)
Denosumab injection What is this medicine? DENOSUMAB (den oh sue mab) slows bone breakdown. Prolia is used to treat osteoporosis in women after menopause and in men, and in people who are taking corticosteroids for 6 months or more. Xgeva is used to treat a high calcium level due to cancer and to prevent bone fractures and other bone problems caused by multiple myeloma or cancer bone metastases. Xgeva is also used to treat giant cell tumor of the bone. This medicine may be used for other purposes; ask your health care provider or pharmacist if you have questions. COMMON BRAND NAME(S): Prolia, XGEVA What should I tell my health care provider before I take this medicine? They need to know if you have any of these conditions:  dental disease  having surgery or tooth extraction  infection  kidney disease  low levels of calcium or Vitamin D in the blood  malnutrition  on hemodialysis  skin conditions or sensitivity  thyroid or parathyroid disease  an unusual reaction to denosumab, other medicines, foods, dyes, or preservatives  pregnant or trying to get pregnant  breast-feeding How should I use this medicine? This medicine is for injection under the skin. It is given by a health care professional in a hospital or clinic setting. A special MedGuide will be given to you before each treatment. Be sure to read this information carefully each time. For Prolia, talk to your pediatrician regarding the use of this medicine in children. Special care may be needed. For Xgeva, talk to your pediatrician regarding the use of this medicine in children. While this drug may be prescribed for children as young as 13 years for selected conditions, precautions do apply. Overdosage: If you think you have taken too much of this medicine contact a poison control center or emergency room at once. NOTE: This medicine is only for you. Do not share this medicine with others. What if I miss a dose? It is  important not to miss your dose. Call your doctor or health care professional if you are unable to keep an appointment. What may interact with this medicine? Do not take this medicine with any of the following medications:  other medicines containing denosumab This medicine may also interact with the following medications:  medicines that lower your chance of fighting infection  steroid medicines like prednisone or cortisone This list may not describe all possible interactions. Give your health care provider a list of all the medicines, herbs, non-prescription drugs, or dietary supplements you use. Also tell them if you smoke, drink alcohol, or use illegal drugs. Some items may interact with your medicine. What should I watch for while using this medicine? Visit your doctor or health care professional for regular checks on your progress. Your doctor or health care professional may order blood tests and other tests to see how you are doing. Call your doctor or health care professional for advice if you get a fever, chills or sore throat, or other symptoms of a cold or flu. Do not treat yourself. This drug may decrease your body's ability to fight infection. Try to avoid being around people who are sick. You should make sure you get enough calcium and vitamin D while you are taking this medicine, unless your doctor tells you not to. Discuss the foods you eat and the vitamins you take with your health care professional. See your dentist regularly. Brush and floss your teeth as directed. Before you have any dental work done, tell your dentist you are   receiving this medicine. Do not become pregnant while taking this medicine or for 5 months after stopping it. Talk with your doctor or health care professional about your birth control options while taking this medicine. Women should inform their doctor if they wish to become pregnant or think they might be pregnant. There is a potential for serious side  effects to an unborn child. Talk to your health care professional or pharmacist for more information. What side effects may I notice from receiving this medicine? Side effects that you should report to your doctor or health care professional as soon as possible:  allergic reactions like skin rash, itching or hives, swelling of the face, lips, or tongue  bone pain  breathing problems  dizziness  jaw pain, especially after dental work  redness, blistering, peeling of the skin  signs and symptoms of infection like fever or chills; cough; sore throat; pain or trouble passing urine  signs of low calcium like fast heartbeat, muscle cramps or muscle pain; pain, tingling, numbness in the hands or feet; seizures  unusual bleeding or bruising  unusually weak or tired Side effects that usually do not require medical attention (report to your doctor or health care professional if they continue or are bothersome):  constipation  diarrhea  headache  joint pain  loss of appetite  muscle pain  runny nose  tiredness  upset stomach This list may not describe all possible side effects. Call your doctor for medical advice about side effects. You may report side effects to FDA at 1-800-FDA-1088. Where should I keep my medicine? This medicine is only given in a clinic, doctor's office, or other health care setting and will not be stored at home. NOTE: This sheet is a summary. It may not cover all possible information. If you have questions about this medicine, talk to your doctor, pharmacist, or health care provider.  2021 Elsevier/Gold Standard (2018-02-22 16:10:44)

## 2021-03-02 ENCOUNTER — Other Ambulatory Visit: Payer: Self-pay

## 2021-03-02 DIAGNOSIS — C7951 Secondary malignant neoplasm of bone: Secondary | ICD-10-CM

## 2021-03-02 MED ORDER — OXYCODONE HCL 20 MG PO TABS: 1.0000 | ORAL_TABLET | ORAL | 0 refills | Status: DC | PRN

## 2021-03-10 ENCOUNTER — Other Ambulatory Visit: Payer: Self-pay

## 2021-03-10 DIAGNOSIS — C7951 Secondary malignant neoplasm of bone: Secondary | ICD-10-CM

## 2021-03-10 DIAGNOSIS — C787 Secondary malignant neoplasm of liver and intrahepatic bile duct: Secondary | ICD-10-CM

## 2021-03-10 MED ORDER — FENTANYL 100 MCG/HR TD PT72: 1.0000 | MEDICATED_PATCH | TRANSDERMAL | 0 refills | Status: DC

## 2021-03-10 MED ORDER — FENTANYL 50 MCG/HR TD PT72: 1.0000 | MEDICATED_PATCH | TRANSDERMAL | 0 refills | Status: DC

## 2021-03-18 ENCOUNTER — Encounter: Payer: Self-pay | Admitting: Hematology and Oncology

## 2021-03-18 ENCOUNTER — Inpatient Hospital Stay (INDEPENDENT_AMBULATORY_CARE_PROVIDER_SITE_OTHER): Payer: Medicare Other | Admitting: Hematology and Oncology

## 2021-03-18 ENCOUNTER — Telehealth: Payer: Self-pay | Admitting: Hematology and Oncology

## 2021-03-18 ENCOUNTER — Other Ambulatory Visit: Payer: Self-pay

## 2021-03-18 ENCOUNTER — Inpatient Hospital Stay: Payer: Medicare Other | Attending: Oncology

## 2021-03-18 VITALS — BP 110/71 | HR 75 | Temp 98.3°F | Resp 18 | Ht 62.0 in | Wt 131.0 lb

## 2021-03-18 DIAGNOSIS — Z170 Estrogen receptor positive status [ER+]: Secondary | ICD-10-CM

## 2021-03-18 DIAGNOSIS — C7952 Secondary malignant neoplasm of bone marrow: Secondary | ICD-10-CM

## 2021-03-18 DIAGNOSIS — C50411 Malignant neoplasm of upper-outer quadrant of right female breast: Secondary | ICD-10-CM | POA: Diagnosis not present

## 2021-03-18 DIAGNOSIS — C787 Secondary malignant neoplasm of liver and intrahepatic bile duct: Secondary | ICD-10-CM

## 2021-03-18 DIAGNOSIS — C7951 Secondary malignant neoplasm of bone: Secondary | ICD-10-CM

## 2021-03-18 LAB — CBC AND DIFFERENTIAL
HCT: 45 (ref 36–46)
Hemoglobin: 14.8 (ref 12.0–16.0)
Neutrophils Absolute: 2.39
Platelets: 196 (ref 150–399)
WBC: 3.8

## 2021-03-18 LAB — HEPATIC FUNCTION PANEL
ALT: 45 — AB (ref 7–35)
AST: 68 — AB (ref 13–35)
Alkaline Phosphatase: 87 (ref 25–125)
Bilirubin, Total: 0.6

## 2021-03-18 LAB — COMPREHENSIVE METABOLIC PANEL
Albumin: 4.1 (ref 3.5–5.0)
Calcium: 9.1 (ref 8.7–10.7)

## 2021-03-18 LAB — BASIC METABOLIC PANEL
BUN: 10 (ref 4–21)
CO2: 26 — AB (ref 13–22)
Chloride: 104 (ref 99–108)
Creatinine: 0.7 (ref 0.5–1.1)
Glucose: 99
Potassium: 4 (ref 3.4–5.3)
Sodium: 138 (ref 137–147)

## 2021-03-18 LAB — CBC: RBC: 5.4 — AB (ref 3.87–5.11)

## 2021-03-18 MED ORDER — EPINEPHRINE 0.3 MG/0.3ML IJ SOAJ: 0.3000 mg | INTRAMUSCULAR | 0 refills | Status: AC | PRN

## 2021-03-18 MED ORDER — ZOLPIDEM TARTRATE ER 12.5 MG PO TBCR: 12.5000 mg | EXTENDED_RELEASE_TABLET | Freq: Every evening | ORAL | 3 refills | Status: DC | PRN

## 2021-03-18 MED ORDER — ALPRAZOLAM 1 MG PO TABS: 1.0000 mg | ORAL_TABLET | Freq: Three times a day (TID) | ORAL | 0 refills | Status: DC | PRN
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
## 2021-03-18 NOTE — Telephone Encounter (Signed)
Per 5/20 LOS, patient scheduled for June Appt's.  Gave patient Appt Summary

## 2021-03-19 ENCOUNTER — Other Ambulatory Visit: Payer: Self-pay | Admitting: Hematology and Oncology

## 2021-03-19 DIAGNOSIS — F32A Depression, unspecified: Secondary | ICD-10-CM

## 2021-03-19 DIAGNOSIS — F411 Generalized anxiety disorder: Secondary | ICD-10-CM

## 2021-03-21 ENCOUNTER — Telehealth: Payer: Self-pay

## 2021-03-21 ENCOUNTER — Inpatient Hospital Stay: Payer: Medicare Other

## 2021-03-22 ENCOUNTER — Other Ambulatory Visit: Payer: Self-pay | Admitting: Hematology and Oncology

## 2021-03-22 ENCOUNTER — Encounter: Payer: Self-pay | Admitting: Oncology

## 2021-03-24 ENCOUNTER — Other Ambulatory Visit: Payer: Self-pay

## 2021-03-24 NOTE — Telephone Encounter (Signed)
I called & spoke with a pharmacy tech. She states the Ambien CR hasn't been filled because insurance is requiring a prior authorization. Pt has received this prescription several times, not sure why need PA now. The tech states she will fax [place] the PA form to be completed. I notified Levada Dy Evans,LPN, to be watchful for the form so we can get it handled quickly.  Melissa,NP, aware also.

## 2021-03-25 ENCOUNTER — Other Ambulatory Visit: Payer: Self-pay | Admitting: Hematology and Oncology

## 2021-03-25 MED ORDER — BELSOMRA 15 MG PO TABS: 15.0000 mg | ORAL_TABLET | Freq: Every day | ORAL | 0 refills | Status: DC

## 2021-03-25 NOTE — Telephone Encounter (Signed)
The Ambien CR has been denied. The denial forms have been given to [facility] P,NP by Levada Dy Evans,LPN for f/u. The insurance company is requesting pt try 2 other drugs.

## 2021-03-29 ENCOUNTER — Encounter: Payer: Self-pay | Admitting: Oncology

## 2021-03-29 ENCOUNTER — Other Ambulatory Visit: Payer: Self-pay

## 2021-03-30 ENCOUNTER — Other Ambulatory Visit: Payer: Self-pay | Admitting: Hematology and Oncology

## 2021-03-30 MED ORDER — AMOXICILLIN-POT CLAVULANATE 875-125 MG PO TABS: 1.0000 | ORAL_TABLET | Freq: Two times a day (BID) | ORAL | 0 refills | Status: DC

## 2021-04-12 ENCOUNTER — Other Ambulatory Visit: Payer: Self-pay | Admitting: Oncology

## 2021-04-13 ENCOUNTER — Other Ambulatory Visit: Payer: Self-pay | Admitting: Hematology and Oncology
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
## 2021-04-15 ENCOUNTER — Other Ambulatory Visit: Payer: Self-pay

## 2021-04-15 ENCOUNTER — Inpatient Hospital Stay (INDEPENDENT_AMBULATORY_CARE_PROVIDER_SITE_OTHER): Payer: Medicare Other | Admitting: Hematology and Oncology

## 2021-04-15 ENCOUNTER — Encounter: Payer: Self-pay | Admitting: Hematology and Oncology

## 2021-04-15 ENCOUNTER — Inpatient Hospital Stay: Payer: Medicare Other | Attending: Oncology

## 2021-04-15 ENCOUNTER — Other Ambulatory Visit: Payer: Self-pay | Admitting: Hematology and Oncology

## 2021-04-15 VITALS — BP 137/82 | HR 92 | Temp 98.2°F | Resp 18 | Ht 62.0 in | Wt 129.7 lb

## 2021-04-15 DIAGNOSIS — C50411 Malignant neoplasm of upper-outer quadrant of right female breast: Secondary | ICD-10-CM

## 2021-04-15 DIAGNOSIS — C7951 Secondary malignant neoplasm of bone: Secondary | ICD-10-CM

## 2021-04-15 DIAGNOSIS — C7952 Secondary malignant neoplasm of bone marrow: Secondary | ICD-10-CM

## 2021-04-15 DIAGNOSIS — Z170 Estrogen receptor positive status [ER+]: Secondary | ICD-10-CM

## 2021-04-15 DIAGNOSIS — C787 Secondary malignant neoplasm of liver and intrahepatic bile duct: Secondary | ICD-10-CM

## 2021-04-15 LAB — CBC AND DIFFERENTIAL
HCT: 40 (ref 36–46)
Hemoglobin: 13.5 (ref 12.0–16.0)
Neutrophils Absolute: 3.22
Platelets: 245 (ref 150–399)
WBC: 4.8

## 2021-04-15 LAB — COMPREHENSIVE METABOLIC PANEL
Albumin: 4.3 (ref 3.5–5.0)
Calcium: 8.6 — AB (ref 8.7–10.7)

## 2021-04-15 LAB — HEPATIC FUNCTION PANEL
ALT: 54 — AB (ref 7–35)
AST: 87 — AB (ref 13–35)
Alkaline Phosphatase: 122 (ref 25–125)
Bilirubin, Total: 0.5

## 2021-04-15 LAB — BASIC METABOLIC PANEL
BUN: 10 (ref 4–21)
CO2: 28 — AB (ref 13–22)
Chloride: 104 (ref 99–108)
Creatinine: 0.7 (ref 0.5–1.1)
Glucose: 99
Potassium: 4.2 (ref 3.4–5.3)
Sodium: 138 (ref 137–147)

## 2021-04-15 LAB — CBC: RBC: 4.93 (ref 3.87–5.11)

## 2021-04-15 MED ORDER — FENTANYL 100 MCG/HR TD PT72: 1.0000 | MEDICATED_PATCH | TRANSDERMAL | 0 refills | Status: DC

## 2021-04-15 MED ORDER — OXYCODONE HCL 30 MG PO TABA: 30.0000 mg | ORAL_TABLET | ORAL | 0 refills | Status: DC | PRN

## 2021-04-15 MED ORDER — ONDANSETRON HCL 8 MG PO TABS: 8.0000 mg | ORAL_TABLET | Freq: Three times a day (TID) | ORAL | 3 refills | Status: AC | PRN

## 2021-04-15 MED ORDER — ALPRAZOLAM 1 MG PO TABS: 1.0000 mg | ORAL_TABLET | Freq: Four times a day (QID) | ORAL | 0 refills | Status: DC | PRN

## 2021-04-15 MED ORDER — FENTANYL 50 MCG/HR TD PT72: 1.0000 | MEDICATED_PATCH | TRANSDERMAL | 0 refills | Status: DC

## 2021-04-18 ENCOUNTER — Encounter: Payer: Self-pay | Admitting: Oncology

## 2021-04-18 ENCOUNTER — Ambulatory Visit: Payer: Medicare Other

## 2021-04-20 ENCOUNTER — Other Ambulatory Visit: Payer: Self-pay | Admitting: Hematology and Oncology

## 2021-04-20 ENCOUNTER — Encounter: Payer: Self-pay | Admitting: Oncology

## 2021-04-20 MED ORDER — CLOTRIMAZOLE-BETAMETHASONE 1-0.05 % EX CREA: 1.0000 "application " | TOPICAL_CREAM | Freq: Two times a day (BID) | CUTANEOUS | 0 refills | Status: AC
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
## 2021-05-09 ENCOUNTER — Inpatient Hospital Stay: Payer: Medicare Other

## 2021-05-09 ENCOUNTER — Other Ambulatory Visit: Payer: Self-pay | Admitting: Hematology and Oncology

## 2021-05-09 ENCOUNTER — Inpatient Hospital Stay: Payer: Medicare Other | Attending: Oncology | Admitting: Oncology

## 2021-05-12 ENCOUNTER — Inpatient Hospital Stay (INDEPENDENT_AMBULATORY_CARE_PROVIDER_SITE_OTHER): Payer: Medicare Other | Admitting: Hematology and Oncology

## 2021-05-12 ENCOUNTER — Other Ambulatory Visit: Payer: Self-pay

## 2021-05-12 ENCOUNTER — Inpatient Hospital Stay: Payer: Medicare Other

## 2021-05-12 ENCOUNTER — Telehealth: Payer: Self-pay

## 2021-05-12 VITALS — BP 129/91 | HR 107 | Temp 98.0°F | Resp 16 | Wt 134.4 lb

## 2021-05-12 DIAGNOSIS — C50411 Malignant neoplasm of upper-outer quadrant of right female breast: Secondary | ICD-10-CM

## 2021-05-12 DIAGNOSIS — C7951 Secondary malignant neoplasm of bone: Secondary | ICD-10-CM

## 2021-05-12 DIAGNOSIS — C7952 Secondary malignant neoplasm of bone marrow: Secondary | ICD-10-CM

## 2021-05-12 DIAGNOSIS — C787 Secondary malignant neoplasm of liver and intrahepatic bile duct: Secondary | ICD-10-CM

## 2021-05-12 DIAGNOSIS — R0781 Pleurodynia: Secondary | ICD-10-CM

## 2021-05-12 DIAGNOSIS — G4700 Insomnia, unspecified: Secondary | ICD-10-CM

## 2021-05-12 DIAGNOSIS — Z170 Estrogen receptor positive status [ER+]: Secondary | ICD-10-CM

## 2021-05-12 DIAGNOSIS — R071 Chest pain on breathing: Secondary | ICD-10-CM

## 2021-05-12 LAB — HEPATIC FUNCTION PANEL
ALT: 41 — AB (ref 7–35)
AST: 83 — AB (ref 13–35)
Alkaline Phosphatase: 151 — AB (ref 25–125)
Bilirubin, Total: 0.4

## 2021-05-12 LAB — CBC AND DIFFERENTIAL
HCT: 40 (ref 36–46)
Hemoglobin: 13.5 (ref 12.0–16.0)
Neutrophils Absolute: 3.74
Platelets: 255 (ref 150–399)
WBC: 5.2

## 2021-05-12 LAB — BASIC METABOLIC PANEL
BUN: 11 (ref 4–21)
CO2: 26 — AB (ref 13–22)
Chloride: 101 (ref 99–108)
Creatinine: 0.7 (ref 0.5–1.1)
Glucose: 90
Potassium: 4.2 (ref 3.4–5.3)
Sodium: 136 — AB (ref 137–147)

## 2021-05-12 LAB — COMPREHENSIVE METABOLIC PANEL
Albumin: 4 (ref 3.5–5.0)
Calcium: 9.8 (ref 8.7–10.7)

## 2021-05-12 LAB — CBC: RBC: 4.87 (ref 3.87–5.11)

## 2021-05-12 MED ORDER — TORSEMIDE 20 MG PO TABS: 20.0000 mg | ORAL_TABLET | Freq: Every day | ORAL | 2 refills | Status: AC

## 2021-05-12 MED ORDER — OXYCODONE HCL 30 MG PO TABA: 30.0000 mg | ORAL_TABLET | ORAL | 0 refills | Status: DC | PRN

## 2021-05-12 MED ORDER — CETIRIZINE HCL 10 MG PO TABS: 10.0000 mg | ORAL_TABLET | Freq: Every day | ORAL | 2 refills | Status: AC

## 2021-05-12 MED ORDER — EXEMESTANE 25 MG PO TABS: 25.0000 mg | ORAL_TABLET | Freq: Every day | ORAL | 5 refills | Status: DC

## 2021-05-12 MED ORDER — FENTANYL 100 MCG/HR TD PT72: 1.0000 | MEDICATED_PATCH | TRANSDERMAL | 0 refills | Status: DC

## 2021-05-12 MED ORDER — FENTANYL 50 MCG/HR TD PT72: 1.0000 | MEDICATED_PATCH | TRANSDERMAL | 0 refills | Status: DC

## 2021-05-12 MED ORDER — BELSOMRA 15 MG PO TABS: 15.0000 mg | ORAL_TABLET | Freq: Every day | ORAL | 1 refills | Status: AC
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
## 2021-05-12 NOTE — Telephone Encounter (Signed)
Kelli,PA: OK, thanks! I guess if CT was negative, I'm not sure what else I can do except change her fluid pill   Mekia Dipinto, RN: She states they did CT and it was negative. She is coming on...probably be here @ 230p. Im trying to see if I can get anything from [facility]. I can see some info, like the rib xray from 6/30 fall... but I don't see a CT. CT was definitely before fall.   Marylu LundVida Roller requested records from [facility] hospital if we can get them (to avoid duplicating tests, etc).    Appt Received: Today Dairl Ponder, RN  Mosher, Thalia Bloodgood, PA-C Phone Number:[phone]   Pt called to ask if she could be seen today or tomorrow. States I can come right now or whenever. She is afraid if she isn't seen, she will end up back in the ER over the weekend. Pt states, "Something isn't right. I'm having trouble taking deep breaths. I had a bad fall Thursday, and ever since my lymphedema is worse. Im taking lasix 60mg  daily, but it isn't really helping. I can only wear my bedroom shoes. I missed my appt on Monday because I was in court for car tag stuff".   Please advise.

## 2021-05-13 ENCOUNTER — Encounter: Payer: Self-pay | Admitting: Hematology and Oncology

## 2021-05-16 ENCOUNTER — Inpatient Hospital Stay: Payer: Medicare Other

## 2021-05-16 ENCOUNTER — Other Ambulatory Visit: Payer: Self-pay | Admitting: Hematology and Oncology

## 2021-05-16 ENCOUNTER — Telehealth: Payer: Self-pay | Admitting: Hematology and Oncology

## 2021-05-16 ENCOUNTER — Encounter: Payer: Self-pay | Admitting: Oncology

## 2021-05-16 DIAGNOSIS — C787 Secondary malignant neoplasm of liver and intrahepatic bile duct: Secondary | ICD-10-CM

## 2021-05-16 DIAGNOSIS — C50411 Malignant neoplasm of upper-outer quadrant of right female breast: Secondary | ICD-10-CM

## 2021-05-16 DIAGNOSIS — C7951 Secondary malignant neoplasm of bone: Secondary | ICD-10-CM

## 2021-05-16 MED ORDER — OXYCODONE HCL 30 MG PO TABA: 30.0000 mg | ORAL_TABLET | ORAL | 0 refills | Status: DC | PRN

## 2021-05-16 MED ORDER — FENTANYL 50 MCG/HR TD PT72: 1.0000 | MEDICATED_PATCH | TRANSDERMAL | 0 refills | Status: AC

## 2021-05-16 MED ORDER — FENTANYL 100 MCG/HR TD PT72: 1.0000 | MEDICATED_PATCH | TRANSDERMAL | 0 refills | Status: AC

## 2021-05-17 ENCOUNTER — Encounter: Payer: Self-pay | Admitting: Oncology

## 2021-05-17 ENCOUNTER — Other Ambulatory Visit: Payer: Self-pay | Admitting: Hematology and Oncology

## 2021-05-17 ENCOUNTER — Inpatient Hospital Stay: Payer: Medicare Other

## 2021-05-17 DIAGNOSIS — C787 Secondary malignant neoplasm of liver and intrahepatic bile duct: Secondary | ICD-10-CM

## 2021-05-17 DIAGNOSIS — C7951 Secondary malignant neoplasm of bone: Secondary | ICD-10-CM

## 2021-05-17 MED ORDER — OXYCODONE HCL 30 MG PO TABA: 30.0000 mg | ORAL_TABLET | ORAL | 0 refills | Status: DC | PRN

## 2021-05-19 ENCOUNTER — Ambulatory Visit: Payer: Medicare Other | Admitting: Oncology

## 2021-05-19 DIAGNOSIS — N926 Irregular menstruation, unspecified: Secondary | ICD-10-CM | POA: Insufficient documentation

## 2021-05-19 DIAGNOSIS — E559 Vitamin D deficiency, unspecified: Secondary | ICD-10-CM | POA: Insufficient documentation

## 2021-05-19 DIAGNOSIS — J309 Allergic rhinitis, unspecified: Secondary | ICD-10-CM | POA: Insufficient documentation

## 2021-05-19 DIAGNOSIS — E042 Nontoxic multinodular goiter: Secondary | ICD-10-CM | POA: Insufficient documentation

## 2021-05-19 DIAGNOSIS — F419 Anxiety disorder, unspecified: Secondary | ICD-10-CM | POA: Insufficient documentation

## 2021-05-19 DIAGNOSIS — R41840 Attention and concentration deficit: Secondary | ICD-10-CM | POA: Insufficient documentation

## 2021-05-19 DIAGNOSIS — E1165 Type 2 diabetes mellitus with hyperglycemia: Secondary | ICD-10-CM | POA: Insufficient documentation

## 2021-05-19 DIAGNOSIS — G4700 Insomnia, unspecified: Secondary | ICD-10-CM | POA: Insufficient documentation

## 2021-05-19 DIAGNOSIS — G43829 Menstrual migraine, not intractable, without status migrainosus: Secondary | ICD-10-CM | POA: Insufficient documentation

## 2021-05-19 DIAGNOSIS — I69319 Unspecified symptoms and signs involving cognitive functions following cerebral infarction: Secondary | ICD-10-CM | POA: Insufficient documentation

## 2021-05-19 DIAGNOSIS — E039 Hypothyroidism, unspecified: Secondary | ICD-10-CM | POA: Insufficient documentation

## 2021-05-19 DIAGNOSIS — D518 Other vitamin B12 deficiency anemias: Secondary | ICD-10-CM | POA: Insufficient documentation

## 2021-05-19 DIAGNOSIS — M150 Primary generalized (osteo)arthritis: Secondary | ICD-10-CM | POA: Insufficient documentation

## 2021-05-20 ENCOUNTER — Encounter: Payer: Self-pay | Admitting: Oncology

## 2021-05-20 ENCOUNTER — Encounter: Payer: Self-pay | Admitting: Hematology and Oncology

## 2021-05-26 ENCOUNTER — Other Ambulatory Visit: Payer: Self-pay | Admitting: Hematology and Oncology

## 2021-05-27 ENCOUNTER — Other Ambulatory Visit: Payer: Self-pay | Admitting: Oncology

## 2021-05-27 DIAGNOSIS — F419 Anxiety disorder, unspecified: Secondary | ICD-10-CM

## 2021-05-27 MED ORDER — ALPRAZOLAM 1 MG PO TABS: 1.0000 mg | ORAL_TABLET | Freq: Four times a day (QID) | ORAL | 0 refills | Status: AC | PRN

## 2021-05-31 ENCOUNTER — Encounter: Payer: Self-pay | Admitting: Oncology

## 2021-06-02 ENCOUNTER — Other Ambulatory Visit: Payer: Self-pay

## 2021-06-02 ENCOUNTER — Telehealth: Payer: Self-pay | Admitting: Hematology and Oncology

## 2021-06-02 DIAGNOSIS — Z170 Estrogen receptor positive status [ER+]: Secondary | ICD-10-CM

## 2021-06-02 DIAGNOSIS — C787 Secondary malignant neoplasm of liver and intrahepatic bile duct: Secondary | ICD-10-CM

## 2021-06-02 DIAGNOSIS — C7951 Secondary malignant neoplasm of bone: Secondary | ICD-10-CM

## 2021-06-02 NOTE — Telephone Encounter (Signed)
Called patient about everolimus refill. She had scans then missed an appt with Dr. Hinton Rao. She states she has been in [place] ED this week for "burning in her stomach" and they didn't figure out anything. I advised her that her cancer is worse and this is likely causing her symptoms. She needs to see Dr. Hinton Rao, but her schedule is full. I advised Shakisha I would talk to Dr. Hinton Rao regarding rescheduling her.  Telephoned patient back to see if she could come tomorrow afternoon, but she did not answer. Left msg for her to call.

## 2021-06-03 ENCOUNTER — Other Ambulatory Visit: Payer: Self-pay | Admitting: Hematology and Oncology

## 2021-06-03 ENCOUNTER — Other Ambulatory Visit: Payer: Self-pay

## 2021-06-03 ENCOUNTER — Encounter: Payer: Self-pay | Admitting: Oncology

## 2021-06-03 ENCOUNTER — Telehealth: Payer: Self-pay | Admitting: Oncology

## 2021-06-03 ENCOUNTER — Inpatient Hospital Stay: Payer: Medicare Other | Attending: Oncology | Admitting: Oncology

## 2021-06-03 ENCOUNTER — Inpatient Hospital Stay: Payer: Medicare Other

## 2021-06-03 ENCOUNTER — Telehealth: Payer: Self-pay

## 2021-06-03 VITALS — BP 112/75 | HR 138 | Temp 98.3°F | Resp 20 | Ht 62.0 in | Wt 130.3 lb

## 2021-06-03 DIAGNOSIS — C787 Secondary malignant neoplasm of liver and intrahepatic bile duct: Secondary | ICD-10-CM | POA: Diagnosis not present

## 2021-06-03 DIAGNOSIS — C50511 Malignant neoplasm of lower-outer quadrant of right female breast: Secondary | ICD-10-CM | POA: Diagnosis not present

## 2021-06-03 DIAGNOSIS — Z170 Estrogen receptor positive status [ER+]: Secondary | ICD-10-CM | POA: Diagnosis not present

## 2021-06-03 DIAGNOSIS — C7952 Secondary malignant neoplasm of bone marrow: Secondary | ICD-10-CM

## 2021-06-03 DIAGNOSIS — C7951 Secondary malignant neoplasm of bone: Secondary | ICD-10-CM

## 2021-06-03 DIAGNOSIS — C50411 Malignant neoplasm of upper-outer quadrant of right female breast: Secondary | ICD-10-CM

## 2021-06-03 LAB — HEPATIC FUNCTION PANEL
ALT: 72 — AB (ref 7–35)
AST: 299 — AB (ref 13–35)
Alkaline Phosphatase: 290 — AB (ref 25–125)
Bilirubin, Total: 2.4

## 2021-06-03 LAB — BASIC METABOLIC PANEL
BUN: 5 (ref 4–21)
CO2: 28 — AB (ref 13–22)
Chloride: 95 — AB (ref 99–108)
Creatinine: 0.6 (ref 0.5–1.1)
Glucose: 206
Potassium: 2.7 — AB (ref 3.4–5.3)
Sodium: 134 — AB (ref 137–147)

## 2021-06-03 LAB — COMPREHENSIVE METABOLIC PANEL
Albumin: 3.5 (ref 3.5–5.0)
Calcium: 8.1 — AB (ref 8.7–10.7)

## 2021-06-03 LAB — CBC AND DIFFERENTIAL
HCT: 41 (ref 36–46)
Hemoglobin: 13.3 (ref 12.0–16.0)
Neutrophils Absolute: 12.21
Platelets: 235 (ref 150–399)
WBC: 14.2

## 2021-06-03 LAB — CBC: RBC: 4.9 (ref 3.87–5.11)

## 2021-06-03 NOTE — Progress Notes (Signed)
Patient drove self to clinic, can barely hold eyes open, and some slurring of words. Patient states she was released from [facility] this morning at 0230 after a 2 day stay.

## 2021-06-03 NOTE — Telephone Encounter (Addendum)
Kelli,PA, has placed pt on Dr Remi Deter schedule for 2pm today.   I attempted call to pt this morning, 06/03/21. No answer.

## 2021-06-03 NOTE — Telephone Encounter (Signed)
Faxed information for hospice referral on patient to [facility] and [facility] in [place], [place]. Patient made aware.

## 2021-06-03 NOTE — Telephone Encounter (Signed)
Per Levada Dy, patient went to ER

## 2021-06-06 ENCOUNTER — Telehealth: Payer: Self-pay

## 2021-06-06 NOTE — Telephone Encounter (Signed)
I spoke with Danielle @ [employer]. I notified her that pt has been referred to Hospice services.

## 2021-06-07 ENCOUNTER — Telehealth: Payer: Self-pay

## 2021-06-07 ENCOUNTER — Encounter: Payer: Self-pay | Admitting: Oncology

## 2021-06-07 NOTE — Telephone Encounter (Signed)
ER records printed and placed in Dr. Remi Deter box for her viewing.

## 2021-06-07 NOTE — Telephone Encounter (Signed)
-----   Message from Derwood Kaplan, MD sent at 06/06/2021  6:56 PM EDT ----- Regarding: records Would you print the ER report from Friday night?

## 2021-06-09 NOTE — Progress Notes (Signed)
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 any treatment, and I told her possibly a few months. She states her will is not complete, so we discussed the importance of getting her affairs in order, no matter what she chooses to do regarding her cancer treatment. She reported persistent lower extremity swelling despite torsemide 10 mg daily, so I advised her to increase this to 20 mg daily. We plan to see the patient back next week to discuss treatment options.  We will also have her see Dr. Orlene Erm for possible palliative radiation to the right ribs.

## 2021-06-13 ENCOUNTER — Other Ambulatory Visit: Payer: Self-pay | Admitting: Oncology

## 2021-06-30 DEATH — deceased
